# Patient Record
Sex: Female | Born: 1950 | Race: White | Hispanic: No | Marital: Married | Smoking: Never smoker
Health system: Southern US, Community
[De-identification: ages and names within clinical notes are randomized; demographics above are authoritative.]

## PROBLEM LIST (undated history)

## (undated) DIAGNOSIS — R319 Hematuria, unspecified: Secondary | ICD-10-CM

## (undated) DIAGNOSIS — N329 Bladder disorder, unspecified: Secondary | ICD-10-CM

## (undated) DIAGNOSIS — F411 Generalized anxiety disorder: Secondary | ICD-10-CM

## (undated) DIAGNOSIS — N289 Disorder of kidney and ureter, unspecified: Secondary | ICD-10-CM

## (undated) DIAGNOSIS — R011 Cardiac murmur, unspecified: Secondary | ICD-10-CM

## (undated) DIAGNOSIS — K219 Gastro-esophageal reflux disease without esophagitis: Secondary | ICD-10-CM

## (undated) HISTORY — DX: Generalized anxiety disorder: F41.1

## (undated) HISTORY — PX: TUBAL LIGATION: SHX77

## (undated) HISTORY — PX: CHOLECYSTECTOMY: SHX55

---

## 1983-01-14 HISTORY — PX: OTHER SURGICAL HISTORY: SHX169

## 1988-01-14 HISTORY — PX: CHOLECYSTECTOMY: SHX55

## 1999-02-21 ENCOUNTER — Other Ambulatory Visit: Admission: RE | Admit: 1999-02-21 | Discharge: 1999-02-21 | Payer: Self-pay | Admitting: Family Medicine

## 1999-08-05 ENCOUNTER — Encounter: Admission: RE | Admit: 1999-08-05 | Discharge: 1999-08-21 | Payer: Self-pay | Admitting: Family Medicine

## 2000-05-28 ENCOUNTER — Other Ambulatory Visit: Admission: RE | Admit: 2000-05-28 | Discharge: 2000-05-28 | Payer: Self-pay | Admitting: Family Medicine

## 2004-09-02 ENCOUNTER — Ambulatory Visit (HOSPITAL_COMMUNITY): Admission: RE | Admit: 2004-09-02 | Discharge: 2004-09-02 | Payer: Self-pay | Admitting: Orthopedic Surgery

## 2005-02-03 ENCOUNTER — Encounter: Admission: RE | Admit: 2005-02-03 | Discharge: 2005-02-28 | Payer: Self-pay | Admitting: Orthopedic Surgery

## 2008-03-09 ENCOUNTER — Encounter (INDEPENDENT_AMBULATORY_CARE_PROVIDER_SITE_OTHER): Payer: Self-pay | Admitting: General Surgery

## 2008-03-09 ENCOUNTER — Observation Stay (HOSPITAL_COMMUNITY): Admission: EM | Admit: 2008-03-09 | Discharge: 2008-03-10 | Payer: Self-pay | Admitting: Emergency Medicine

## 2008-03-09 HISTORY — PX: APPENDECTOMY: SHX54

## 2009-01-13 HISTORY — PX: LAPAROSCOPIC APPENDECTOMY: SUR753

## 2010-04-30 LAB — URINALYSIS, ROUTINE W REFLEX MICROSCOPIC
Glucose, UA: NEGATIVE mg/dL
Ketones, ur: NEGATIVE mg/dL
Protein, ur: NEGATIVE mg/dL
Specific Gravity, Urine: >1.046 — ABNORMAL HIGH (ref 1.005–1.030)
Urobilinogen, UA: 0.2 mg/dL (ref 0.0–1.0)
pH: 6 (ref 5.0–8.0)

## 2010-04-30 LAB — CBC
Hemoglobin: 13.6 g/dL (ref 12.0–15.0)
MCHC: 35.3 g/dL (ref 30.0–36.0)
MCV: 93.3 fL (ref 78.0–100.0)
Platelets: 219 10*3/uL (ref 150–400)
RDW: 12 % (ref 11.5–15.5)

## 2010-04-30 LAB — DIFFERENTIAL
Basophils Relative: 1 % (ref 0–1)
Lymphs Abs: 0.8 10*3/uL (ref 0.7–4.0)
Neutro Abs: 15.2 10*3/uL — ABNORMAL HIGH (ref 1.7–7.7)
Neutrophils Relative %: 86 % — ABNORMAL HIGH (ref 43–77)

## 2010-04-30 LAB — COMPREHENSIVE METABOLIC PANEL
ALT: 16 U/L (ref 0–35)
AST: 23 U/L (ref 0–37)
Albumin: 4.2 g/dL (ref 3.5–5.2)
BUN: 11 mg/dL (ref 6–23)
Creatinine, Ser: 0.8 mg/dL (ref 0.4–1.2)
GFR calc non Af Amer: >60 mL/min (ref >60)
Glucose, Bld: 126 mg/dL — ABNORMAL HIGH (ref 70–99)
Total Bilirubin: 1.5 mg/dL — ABNORMAL HIGH (ref 0.3–1.2)

## 2010-04-30 LAB — URINE MICROSCOPIC-ADD ON

## 2010-05-28 NOTE — Op Note (Signed)
[name], [name]                 ACCOUNT NO.:  [id number]   MEDICAL RECORD NO.:  [id number]          PATIENT TYPE:  OBV   LOCATION:  5128                         FACILITY:  [facility]   PHYSICIAN:  Cherylynn Ridges, M.D.    DATE OF BIRTH:  1950/06/02   DATE OF PROCEDURE:  03/09/2008  DATE OF DISCHARGE:  03/09/2008                               OPERATIVE REPORT   PREOPERATIVE DIAGNOSIS:  Acute appendicitis.   PREOPERATIVE DIAGNOSIS:  Acute suppurative appendicitis.   PROCEDURE:  Laparoscopic appendectomy.   SURGEON:  Cherylynn Ridges, MD   ANESTHESIA:  General endotracheal.   ESTIMATED BLOOD LOSS:  Less than 20 mL.   COMPLICATIONS:  None.   CONDITION:  Stable.   FINDINGS:  Acute supparative appendicitis in the right paracecal area.   INDICATION FOR OPERATION:  The patient is a 60 year old woman with  abdominal pain starting about 24 hours ago who comes in with a CT scan  demonstrating acute appendicitis.   OPERATION:  The patient was taken to the operating room, and placed on  the table in the supine position.  After an adequate endotracheal  anesthetic was administered, she was prepped and draped in the usual  sterile manner exposing the midline and the entire abdomen.   An infraumbilical midline incision was made using a #15 blade and taken  down to the midline fascia.  We incised the fascia with a 15 blade and  grabbed the edges with the Kocher clamp and bluntly dissected down into  the peritoneal cavity with minimal difficulty.  A pursestring suture of  0 Vicryl was passed around the fascial opening, and then a Hasson  cannula passed into the peritoneal cavity and secured in place with a  pursestring suture.  We subsequently placed a left lower quadrant 11-mm  cannula and a right upper quadrant 5-mm cannula under direct vision.   Within the patient in Trendelenburg and the left side tilted down, we  were able to identify the appendix in the right lower quadrant.  It was  in  the lateral paracecal area.  We mobilized it cauterizing the  mesoappendix sufficient in order to control bleeding, and using an Endo-  GIA 3.5 mm closure with blue stapler across the base of the appendix.  This allowed [place] to then retrieve the appendix through the left lower  quadrant cannula using the EndoCatch bag.  I removed it from the  abdominal cavity.   We then irrigated with about a litre and a half to 2 L of saline  solution in the right lower quadrant.  Once this was done, we removed  the infraumbilical cannula, closing off the fascia using a pursestring  suture which was in place.  We aspirated all fluid and gas from above  the liver as we removed all cannulas.   We injected all incision sites with 0.25% Marcaine with epinephrine,  then we closed the infraumbilical and the left lower quadrant skin sites  using a running subcuticular stitch of 4-0 Monocryl.  Dermabond, Steri-  Strips, and Tegaderm were used on all incisions.  All  counts were  correct.      Cherylynn Ridges, M.D.  Electronically Signed     JOW/MEDQ  D:  03/09/2008  T:  03/10/2008  Job:  [id number]

## 2010-05-28 NOTE — H&P (Signed)
[name], [name]                 ACCOUNT NO.:  [id number]   MEDICAL RECORD NO.:  [id number]          PATIENT TYPE:  EMS   LOCATION:  MAJO                         FACILITY:  [facility]   PHYSICIAN:  Cherylynn Ridges, M.D.    DATE OF BIRTH:  11-01-1950   DATE OF ADMISSION:  03/09/2008  DATE OF DISCHARGE:                              HISTORY & PHYSICAL   PRIMARY CARE PHYSICIAN:  Dr. Lindaann Pascal in [place].   CHIEF COMPLAINT:  The patient is a 60 year old female with abdominal  pain localized to the right lower quadrant clinically and radiologically  convincing for acute appendicitis.   HISTORY OF PRESENT ILLNESS:  The patient started getting sick about 8  o'clock yesterday evening.  At that time, she had some diffuse upper  abdominal discomfort and pain, which she thought was gas.  She self-  treated herself with Tums and Pepto-Bismol without relief.  She was not  able to sleep most of the night.  The pain worsened today where she went  to see a primary care physician at [facility].  At that time, they sent her  to [facility] where a CT scan was done,  which confirmed the presence of acute appendicitis.  She was sent to the  emergency room for evaluation and care.  No call was made to the surgeon  on-call.   PAST MEDICAL HISTORY:  Unremarkable.  She has no history of cardiac,  renal, pulmonary, or liver disease.   PAST SURGICAL HISTORY:  She has had open cholecystectomy performed over  20 years ago and open hiatal hernia repair over 25 years ago.   ALLERGIES:  She has no known drug allergies.   MEDICATIONS:  She takes no medications on a regular basis.   FAMILY HISTORY:  Noncontributory.   REVIEW OF SYSTEMS:  Her last bowel movement was yesterday morning.  She  has been pretty much n.p.o. all day today.  She has had her bowel  movement yesterday morning was normal.  She has had fevers and chills,  nausea but no vomiting.   PHYSICAL EXAMINATION:  VITAL  SIGNS:  Today, she has temperature of  100.5, pulse of 109, blood pressure is 130/75.  HEENT:  She is normocephalic and atraumatic and anicteric.  NECK:  Supple.  No palpable masses.  She has no carotid bruits.  LUNGS:  Clear to auscultation.  CARDIAC:  Regular rhythm.  She is tachycardiac at about 109.  She has a  grade 1/6 systolic ejection murmur at the left lower sternal border  without radiation.  ABDOMEN:  Soft.  Bowel nondistended.  She has an upper midline scar from  both her cholecystectomy and her open hiatal hernia repair.  No other  surgical scars.  She has a few bowel sounds which are present.  She is  tender in her right lower quadrant mildly so on the right upper  quadrant.  No Rovsing's sign.  She has guarding and rebound.  RECTAL:  Not performed.  PELVIC:  Not performed.   LABORATORY STUDIES:  She has a white count of  17,000 with a left shift.  Her hemoglobin is 13.6, hematocrit is normal.  Electrolytes are within  normal limits.  A 12-lead EKG and a portable upright chest x-ray are  pending as preoperative testing.   IMPRESSION:  I have not seen this CAT scan yet, but apparently it does  confirm the presence of acute appendicitis, which I would conclude  clinically based on the patient's history of localized tenderness and  examination.  The patient was taken to the operating room for possible  laparoscopic appendectomy, although she has had upper midline incisions  before, it does not appear as though this will prohibit I being able to  do this laparoscopically.  If not, then we will make right lower  quadrant incision perform an open appendectomy through Rocky-Davis  incision or modified McBurney's.  They explained this to the patient,  and they understand and wish to proceed as soon as possible.  Consent  will be signed and placed on the chart.      Cherylynn Ridges, M.D.  Electronically Signed     JOW/MEDQ  D:  03/09/2008  T:  03/10/2008  Job:  [id number]    cc:   Dr. Lindaann Pascal, [place]

## 2012-11-30 ENCOUNTER — Ambulatory Visit (INDEPENDENT_AMBULATORY_CARE_PROVIDER_SITE_OTHER): Payer: BC Managed Care – PPO | Admitting: Nurse Practitioner

## 2012-11-30 ENCOUNTER — Encounter: Payer: Self-pay | Admitting: Nurse Practitioner

## 2012-11-30 VITALS — BP 115/66 | HR 63 | Temp 97.0°F | Ht 65.0 in | Wt 127.0 lb

## 2012-11-30 DIAGNOSIS — Z0000 Encounter for general adult medical examination without abnormal findings: Secondary | ICD-10-CM

## 2012-11-30 DIAGNOSIS — Z23 Encounter for immunization: Secondary | ICD-10-CM

## 2012-11-30 DIAGNOSIS — Z01419 Encounter for gynecological examination (general) (routine) without abnormal findings: Secondary | ICD-10-CM

## 2012-11-30 LAB — POCT UA - MICROSCOPIC ONLY
Bacteria, U Microscopic: NEGATIVE
Casts, Ur, LPF, POC: NEGATIVE

## 2012-11-30 LAB — POCT CBC
HCT, POC: 39.6 % (ref 37.7–47.9)
Lymph, poc: 1.7 (ref 0.6–3.4)
POC LYMPH PERCENT: 30.7 %L (ref 10–50)
Platelet Count, POC: 215 10*3/uL (ref 142–424)

## 2012-11-30 LAB — POCT URINALYSIS DIPSTICK
Glucose, UA: NEGATIVE
Ketones, UA: NEGATIVE
Protein, UA: NEGATIVE
Urobilinogen, UA: NEGATIVE
pH, UA: 7.5

## 2012-11-30 NOTE — Patient Instructions (Signed)

## 2012-11-30 NOTE — Progress Notes (Signed)
  Subjective:    Patient ID: [name], female    DOB: [birth year], 62 y.o.   MRN: [id number]  HPI Patient here today for CPE and PAP- She has no complaints today- no medical problems and on no medications.    Review of Systems  Constitutional: Negative.   HENT: Negative.   Eyes: Negative.   Respiratory: Negative.   Cardiovascular: Negative.   Gastrointestinal: Negative.   Endocrine: Negative.   Genitourinary: Negative.   Neurological: Negative.   Psychiatric/Behavioral: Negative.        Objective:   Physical Exam  Constitutional: She is oriented to person, place, and time. She appears well-developed and well-nourished.  HENT:  Head: Normocephalic.  Right Ear: Hearing, tympanic membrane, external ear and ear canal normal.  Left Ear: Hearing, tympanic membrane, external ear and ear canal normal.  Nose: Nose normal.  Mouth/Throat: Uvula is midline and oropharynx is clear and moist.  Eyes: Conjunctivae and EOM are normal. Pupils are equal, round, and reactive to light.  Neck: Normal range of motion and full passive range of motion without pain. Neck supple. No JVD present. Carotid bruit is not present. No mass and no thyromegaly present.  Cardiovascular: Normal rate, normal heart sounds and intact distal pulses.   No murmur heard. Pulmonary/Chest: Effort normal and breath sounds normal. Right breast exhibits no inverted nipple, no mass, no nipple discharge, no skin change and no tenderness. Left breast exhibits no inverted nipple, no mass, no nipple discharge, no skin change and no tenderness.  Abdominal: Soft. Bowel sounds are normal. She exhibits no mass. There is no tenderness.  Genitourinary: Vagina normal and uterus normal. No breast swelling, tenderness, discharge or bleeding.  bimanual exam-No adnexal masses or tenderness. Cervix parous and pink- no discharge  Musculoskeletal: Normal range of motion.  Lymphadenopathy:    She has no cervical adenopathy.  Neurological:  She is alert and oriented to person, place, and time.  Skin: Skin is warm and dry.  Psychiatric: She has a normal mood and affect. Her behavior is normal. Judgment and thought content normal.     BP 115/66  Pulse 63  Temp(Src) 97 F (36.1 C) (Oral)  Ht 5\' 5"  (1.651 m)  Wt 127 lb (57.607 kg)  BMI 21.13 kg/m2      Assessment & Plan:   1. Annual physical exam   2. Need for prophylactic vaccination and inoculation against influenza   3. Encounter for routine gynecological examination    Orders Placed This Encounter  Procedures  . CMP14+EGFR  . NMR, lipoprofile  . Thyroid Panel With TSH  . POCT urinalysis dipstick  . POCT UA - Microscopic Only  . POCT CBC     Continue all meds Labs pending Diet and exercise encouraged Health maintenance reviewed Follow up in 1year and prn  Mary-Margaret Daphine Deutscher, FNP

## 2012-12-01 ENCOUNTER — Telehealth: Payer: Self-pay | Admitting: Family Medicine

## 2012-12-01 NOTE — Telephone Encounter (Signed)
In [place] office at [facility] they want you to call in something for a uti because she is burning you saw her yesterday

## 2012-12-01 NOTE — Telephone Encounter (Signed)
Patient urine was clear yesterday needs to bring in urine to recheck

## 2012-12-02 LAB — CMP14+EGFR
ALT: 13 IU/L (ref 0–32)
AST: 14 IU/L (ref 0–40)
Albumin: 4.5 g/dL (ref 3.6–4.8)
Alkaline Phosphatase: 61 IU/L (ref 39–117)
BUN: 17 mg/dL (ref 8–27)
CO2: 26 mmol/L (ref 18–29)
Chloride: 104 mmol/L (ref 97–108)
GFR calc Af Amer: 87 mL/min/{1.73_m2} (ref >59)
Glucose: 101 mg/dL — ABNORMAL HIGH (ref 65–99)
Total Bilirubin: 0.8 mg/dL (ref 0.0–1.2)

## 2012-12-02 LAB — THYROID PANEL WITH TSH
T3 Uptake Ratio: 31 % (ref 24–39)
T4, Total: 8.8 ug/dL (ref 4.5–12.0)
TSH: 1.14 u[IU]/mL (ref 0.450–4.500)

## 2012-12-02 LAB — NMR, LIPOPROFILE
Cholesterol: 182 mg/dL (ref <200)
LDL Particle Number: 1085 nmol/L — ABNORMAL HIGH (ref <1000)
LDL Size: 21.3 nm (ref >20.5)
LDLC SERPL CALC-MCNC: 91 mg/dL (ref <100)
LP-IR Score: <25 (ref <=45)
Small LDL Particle Number: 109 nmol/L (ref <=527)
Triglycerides by NMR: 69 mg/dL (ref <150)

## 2012-12-02 NOTE — Telephone Encounter (Signed)
Talked to health care nurse at [facility] went over her last 3 urines and they all had blood she is going to send urine for culture

## 2012-12-04 LAB — PAP IG W/ RFLX HPV ASCU: PAP Smear Comment: 0

## 2012-12-08 ENCOUNTER — Encounter: Payer: Self-pay | Admitting: *Deleted

## 2012-12-08 NOTE — Progress Notes (Signed)
Quick Note:  Copy of labs sent to patient ______ 

## 2013-01-20 ENCOUNTER — Other Ambulatory Visit: Payer: Self-pay | Admitting: Urology

## 2013-01-21 ENCOUNTER — Encounter (HOSPITAL_BASED_OUTPATIENT_CLINIC_OR_DEPARTMENT_OTHER): Payer: Self-pay | Admitting: *Deleted

## 2013-01-21 NOTE — Progress Notes (Signed)
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 SHE STATED TO JUST CALL AND LEAVE MESSAGE FOR HER WITH INSTRUCTION. WHICH IS DID, NPO AFTER MN. ARRIVE AT 1100. NEEDS HG AND VERIFY HX.

## 2013-01-24 ENCOUNTER — Encounter (HOSPITAL_BASED_OUTPATIENT_CLINIC_OR_DEPARTMENT_OTHER): Payer: Self-pay

## 2013-01-24 ENCOUNTER — Ambulatory Visit (HOSPITAL_BASED_OUTPATIENT_CLINIC_OR_DEPARTMENT_OTHER): Payer: BC Managed Care – PPO | Admitting: Anesthesiology

## 2013-01-24 ENCOUNTER — Encounter (HOSPITAL_BASED_OUTPATIENT_CLINIC_OR_DEPARTMENT_OTHER): Payer: BC Managed Care – PPO | Admitting: Anesthesiology

## 2013-01-24 ENCOUNTER — Ambulatory Visit (HOSPITAL_BASED_OUTPATIENT_CLINIC_OR_DEPARTMENT_OTHER)
Admission: RE | Admit: 2013-01-24 | Discharge: 2013-01-24 | Disposition: A | Discharge status: Home / Self Care | Payer: BC Managed Care – PPO | Source: Ambulatory Visit | Attending: Urology | Admitting: Urology

## 2013-01-24 ENCOUNTER — Encounter (HOSPITAL_BASED_OUTPATIENT_CLINIC_OR_DEPARTMENT_OTHER)
Admission: RE | Disposition: A | Discharge status: Home / Self Care | Payer: Self-pay | Source: Ambulatory Visit | Attending: Urology

## 2013-01-24 DIAGNOSIS — L539 Erythematous condition, unspecified: Secondary | ICD-10-CM | POA: Insufficient documentation

## 2013-01-24 DIAGNOSIS — R3129 Other microscopic hematuria: Secondary | ICD-10-CM | POA: Insufficient documentation

## 2013-01-24 DIAGNOSIS — N329 Bladder disorder, unspecified: Secondary | ICD-10-CM

## 2013-01-24 HISTORY — DX: Hematuria, unspecified: R31.9

## 2013-01-24 HISTORY — PX: CYSTOSCOPY WITH BIOPSY: SHX5122

## 2013-01-24 HISTORY — DX: Bladder disorder, unspecified: N32.9

## 2013-01-24 HISTORY — DX: Cardiac murmur, unspecified: R01.1

## 2013-01-24 HISTORY — PX: CYSTOSCOPY W/ RETROGRADES: SHX1426

## 2013-01-24 LAB — POCT HEMOGLOBIN-HEMACUE: HEMOGLOBIN: 12.8 g/dL (ref 12.0–15.0)

## 2013-01-24 SURGERY — CYSTOSCOPY, WITH BIOPSY
Anesthesia: General | Site: Ureter

## 2013-01-24 MED ORDER — DEXAMETHASONE SODIUM PHOSPHATE 4 MG/ML IJ SOLN
INTRAMUSCULAR | Status: DC | PRN
  Administered 2013-01-24: 10 mg via INTRAVENOUS

## 2013-01-24 MED ORDER — ONDANSETRON HCL 4 MG/2ML IJ SOLN
INTRAMUSCULAR | Status: DC | PRN
  Administered 2013-01-24: 4 mg via INTRAVENOUS

## 2013-01-24 MED ORDER — HYOSCYAMINE SULFATE 0.125 MG PO TABS: 0.1250 mg | ORAL_TABLET | ORAL | Status: DC | PRN

## 2013-01-24 MED ORDER — CEPHALEXIN 500 MG PO CAPS: 500.0000 mg | ORAL_CAPSULE | Freq: Three times a day (TID) | ORAL | Status: DC

## 2013-01-24 MED ORDER — STERILE WATER FOR IRRIGATION IR SOLN
Status: DC | PRN
  Administered 2013-01-24: 3000 mL

## 2013-01-24 MED ORDER — BELLADONNA ALKALOIDS-OPIUM 16.2-60 MG RE SUPP
RECTAL | Status: AC
  Filled 2013-01-24: qty 1

## 2013-01-24 MED ORDER — PHENAZOPYRIDINE HCL 200 MG PO TABS: 200.0000 mg | ORAL_TABLET | Freq: Three times a day (TID) | ORAL | Status: DC | PRN

## 2013-01-24 MED ORDER — PROPOFOL 10 MG/ML IV BOLUS
INTRAVENOUS | Status: DC | PRN
  Administered 2013-01-24: 120 mg via INTRAVENOUS

## 2013-01-24 MED ORDER — MIDAZOLAM HCL 2 MG/2ML IJ SOLN
INTRAMUSCULAR | Status: AC
  Filled 2013-01-24: qty 2

## 2013-01-24 MED ORDER — FENTANYL CITRATE 0.05 MG/ML IJ SOLN
25.0000 ug | INTRAMUSCULAR | Status: DC | PRN
  Filled 2013-01-24: qty 1

## 2013-01-24 MED ORDER — LIDOCAINE HCL (CARDIAC) 20 MG/ML IV SOLN
INTRAVENOUS | Status: DC | PRN
  Administered 2013-01-24: 40 mg via INTRAVENOUS

## 2013-01-24 MED ORDER — FENTANYL CITRATE 0.05 MG/ML IJ SOLN
INTRAMUSCULAR | Status: AC
  Filled 2013-01-24: qty 4

## 2013-01-24 MED ORDER — PROMETHAZINE HCL 25 MG/ML IJ SOLN
6.2500 mg | INTRAMUSCULAR | Status: DC | PRN
  Filled 2013-01-24: qty 1

## 2013-01-24 MED ORDER — LACTATED RINGERS IV SOLN
INTRAVENOUS | Status: DC
  Administered 2013-01-24: 50 mL/h via INTRAVENOUS
  Administered 2013-01-24: 11:00:00 via INTRAVENOUS
  Filled 2013-01-24: qty 1000

## 2013-01-24 MED ORDER — FENTANYL CITRATE 0.05 MG/ML IJ SOLN
INTRAMUSCULAR | Status: DC | PRN
  Administered 2013-01-24 (×2): 25 ug via INTRAVENOUS
  Administered 2013-01-24: 50 ug via INTRAVENOUS

## 2013-01-24 MED ORDER — SENNOSIDES-DOCUSATE SODIUM 8.6-50 MG PO TABS: 1.0000 | ORAL_TABLET | Freq: Two times a day (BID) | ORAL | Status: DC

## 2013-01-24 MED ORDER — BELLADONNA ALKALOIDS-OPIUM 16.2-60 MG RE SUPP
RECTAL | Status: DC | PRN
  Administered 2013-01-24: 1 via RECTAL

## 2013-01-24 MED ORDER — MIDAZOLAM HCL 5 MG/5ML IJ SOLN
INTRAMUSCULAR | Status: DC | PRN
  Administered 2013-01-24: 1 mg via INTRAVENOUS

## 2013-01-24 MED ORDER — IOHEXOL 350 MG/ML SOLN
INTRAVENOUS | Status: DC | PRN
  Administered 2013-01-24: 9 mL

## 2013-01-24 MED ORDER — HYDROCODONE-ACETAMINOPHEN 5-325 MG PO TABS: 1.0000 | ORAL_TABLET | ORAL | Status: DC | PRN

## 2013-01-24 MED ORDER — PHENAZOPYRIDINE HCL 200 MG PO TABS
200.0000 mg | ORAL_TABLET | Freq: Three times a day (TID) | ORAL | Status: DC
  Administered 2013-01-24: 200 mg via ORAL
  Filled 2013-01-24: qty 1
  Filled 2013-01-24: qty 2

## 2013-01-24 MED ORDER — CEFAZOLIN SODIUM-DEXTROSE 2-3 GM-% IV SOLR
2.0000 g | INTRAVENOUS | Status: AC
  Administered 2013-01-24: 2 g via INTRAVENOUS
  Filled 2013-01-24: qty 50

## 2013-01-24 MED ORDER — LACTATED RINGERS IV SOLN
INTRAVENOUS | Status: DC
  Filled 2013-01-24: qty 1000

## 2013-01-24 MED ORDER — LIDOCAINE HCL 2 % EX GEL
CUTANEOUS | Status: DC | PRN
  Administered 2013-01-24: 1 via URETHRAL

## 2013-01-24 SURGICAL SUPPLY — 17 items
BAG DRAIN URO-CYSTO SKYTR STRL (DRAIN) ×3 IMPLANT
BAG DRN UROCATH (DRAIN) ×2
CANISTER SUCT LVC 12 LTR MEDI- (MISCELLANEOUS) ×3 IMPLANT
CATH INTERMIT  6FR 70CM (CATHETERS) ×1 IMPLANT
CLOTH BEACON ORANGE TIMEOUT ST (SAFETY) ×3 IMPLANT
DRAPE CAMERA CLOSED 9X96 (DRAPES) ×3 IMPLANT
ELECT REM PT RETURN 9FT ADLT (ELECTROSURGICAL) ×3
ELECTRODE REM PT RTRN 9FT ADLT (ELECTROSURGICAL) ×2 IMPLANT
GLOVE BIO SURGEON STRL SZ7 (GLOVE) ×3 IMPLANT
GLOVE INDICATOR 7.5 STRL GRN (GLOVE) IMPLANT
GLOVE SURG SS PI 7.5 STRL IVOR (GLOVE) ×2 IMPLANT
GOWN STRL REIN XL XLG (GOWN DISPOSABLE) ×2 IMPLANT
GOWN STRL REUS W/TWL XL LVL3 (GOWN DISPOSABLE) ×2 IMPLANT
NEEDLE HYPO 22GX1.5 SAFETY (NEEDLE) IMPLANT
NS IRRIG 500ML POUR BTL (IV SOLUTION) ×1 IMPLANT
PACK CYSTOSCOPY (CUSTOM PROCEDURE TRAY) ×3 IMPLANT
WATER STERILE IRR 3000ML UROMA (IV SOLUTION) ×3 IMPLANT

## 2013-01-24 NOTE — Anesthesia Procedure Notes (Signed)
Procedure Name: LMA Insertion Date/Time: 01/24/2013 1:00 PM Performed by: Mechele Claude Pre-anesthesia Checklist: Patient identified, Emergency Drugs available, Suction available and Patient being monitored Patient Re-evaluated:Patient Re-evaluated prior to inductionOxygen Delivery Method: Circle System Utilized Preoxygenation: Pre-oxygenation with 100% oxygen Intubation Type: IV induction Ventilation: Mask ventilation without difficulty LMA: LMA inserted LMA Size: 4.0 Number of attempts: 1 Airway Equipment and Method: bite block Placement Confirmation: positive ETCO2 Tube secured with: Tape Dental Injury: Teeth and Oropharynx as per pre-operative assessment

## 2013-01-24 NOTE — Anesthesia Preprocedure Evaluation (Addendum)
Anesthesia Evaluation  Patient identified by MRN, date of birth, ID band Patient awake    Reviewed: Allergy & Precautions, H&P , NPO status , Patient's Chart, lab work & pertinent test results  Airway Mallampati: III TM Distance: >3 FB Neck ROM: Full    Dental  (+) Teeth Intact and Dental Advisory Given   Pulmonary neg pulmonary ROS,  breath sounds clear to auscultation  Pulmonary exam normal       Cardiovascular negative cardio ROS  Rhythm:Regular Rate:Normal     Neuro/Psych negative neurological ROS  negative psych ROS   GI/Hepatic negative GI ROS, Neg liver ROS,   Endo/Other  negative endocrine ROS  Renal/GU negative Renal ROS  negative genitourinary   Musculoskeletal negative musculoskeletal ROS (+)   Abdominal   Peds  Hematology negative hematology ROS (+)   Anesthesia Other Findings   Reproductive/Obstetrics                          Anesthesia Physical Anesthesia Plan  ASA: I  Anesthesia Plan: General   Post-op Pain Management:    Induction:   Airway Management Planned: LMA  Additional Equipment:   Intra-op Plan:   Post-operative Plan: Extubation in OR  Informed Consent: I have reviewed the patients History and Physical, chart, labs and discussed the procedure including the risks, benefits and alternatives for the proposed anesthesia with the patient or authorized representative who has indicated his/her understanding and acceptance.   Dental advisory given  Plan Discussed with: CRNA  Anesthesia Plan Comments:        Anesthesia Quick Evaluation

## 2013-01-24 NOTE — H&P (Signed)
Urology History and Physical Exam  CC: Microscopic Hematuria. Bladder lesion  HPI:  63 year old female presents today for hematuria and bladder lesion.  Her hematuria microscopic in nature.  She underwent a workup with CT hematuria protocol performed 12/14/12.  This was negative for stones, hydronephrosis, filling defects, or enhancing renal masses.  She also had a bladder lesion.  This was discovered during workup with office cystoscopy.  This was flat and erythematous in nature.  It was approximately 1 cm in size.  It was on the right lateral anterior portion of the bladder wall anterior to the right ureteral orifice.  This was concerning for CIS.  This is a new problem.  Nothing makes it better or worse.  She presents today for cystoscopy, bladder biopsy, and bilateral retrograde pyelograms.  We have discussed the risks, benefits, alternatives, and likelihood of achieving goals.  UA 01/15/12 was negative for signs of infection. Negative gross hematuria.  PMH: Past Medical History  Diagnosis Date  . Lesion of bladder   . Hematuria     PSH: Past Surgical History  Procedure Laterality Date  . Tubal ligation    . Appendectomy  03-09-2008  . Cholecystectomy  1990  . Open hiatal hernia repair  1985    Allergies: Allergies  Allergen Reactions  . Asa [Aspirin]   . Ciprofloxacin Hcl Other (See Comments)    Burning  both legs below knees as if on fire.  . Tagamet [Cimetidine]     Medications: No prescriptions prior to admission     Social History: History   Social History  . Marital Status: Married    Spouse Name: N/A    Number of Children: N/A  . Years of Education: N/A   Occupational History  . Not on file.   Social History Main Topics  . Smoking status: Never Smoker   . Smokeless tobacco: Not on file  . Alcohol Use: No  . Drug Use: No  . Sexual Activity: Not on file   Other Topics Concern  . Not on file   Social History Narrative  . No narrative on file     Family History: Family History  Problem Relation Age of Onset  . COPD Mother   . Cancer Father     Review of Systems: Positive: None. Negative: Fever, SOB, or chest pain.  A further 10 point review of systems was negative except what is listed in the HPI.  Physical Exam: Filed Vitals:   01/24/13 1049  BP: 115/64  Pulse: 59  Temp: 97.3 F (36.3 C)  Resp: 18    General: No acute distress.  Awake. Head:  Normocephalic.  Atraumatic. ENT:  EOMI.  Mucous membranes moist Neck:  Supple.  No lymphadenopathy. CV:  S1 present. S2 present. Regular rate. Pulmonary: Equal effort bilaterally.  Clear to auscultation bilaterally. Abdomen: Soft.  Non- tender to palpation. Skin:  Normal turgor.  No visible rash. Extremity: No gross deformity of bilateral upper extremities.  No gross deformity of    bilateral lower extremities. Neurologic: Alert. Appropriate mood.    Studies:  Recent Labs     01/24/13  1108  HGB  12.8    No results found for this basename: NA, K, CL, CO2, BUN, CREATININE, CALCIUM, MAGNESIUM, GFRNONAA, GFRAA,  in the last 72 hours   No results found for this basename: PT, INR, APTT,  in the last 72 hours   No components found with this basename: ABG,     Assessment:  Microscopic Hematuria.  Bladder lesion  Plan: To OR for cystoscopy, bladder biopsy, and bilateral retrograde pyelograms.

## 2013-01-24 NOTE — Discharge Instructions (Signed)
DISCHARGE INSTRUCTIONS FOR TRANSURETHRAL SURGERY OF BLADDER ° °MEDICATIONS:  ° °1. Resume all your other meds from home. ° ° ° °ACTIVITY °1. No heavy lifting >10 pounds for 2 weeks °2. No sexual activity for 2 weeks °3. No strenuous activity for 2 weeks °4. No driving while on narcotic pain medications °5. Drink plenty of water °6. Continue to walk at home - you can still get blood clots when you are at  °home, so keep active, but don't over do it. °7. Your urine may have some blood in it - make sure you drink plenty of water,  °call or come to the ER immediately if your catheter stops draining ° °FOLEY CATHETER  (If you go home with a catheter in place.) °1. Make sure your catheter is attached to your leg at all times - do not let  °anything pull on it °2. If the urine is your tube starts looking dark red or if it stops draining,  °call us immediately or come to the ER °3. Drink plenty of water, if you do notice your urine looking darking, sit down,  °relax and drink lots of water °4. You will be given a leg bag as well as an overnight bag for your catheter -  °MAKE SURE ATTACHED TO YOUR LEG AT ALL TIMES WITH TAPE OR LEG STRAP ° °BATHING °1. You can shower.  You may take a bath unless you have a Foley catheter in place. ° °SIGNS/SYMPTOMS TO CALL: °1. Please call us if you have a fever greater than 101.5, uncontrolled  °nausea/vomiting, uncontrolled pain, dizziness, unable to urinate, chest pain, shortness of breath, leg swelling, leg pain, or any other concerns  °or questions. ° °You can reach us at [phone]. ° ° °Post Anesthesia Home Care Instructions ° °Activity: °Get plenty of rest for the remainder of the day. A responsible adult should stay with you for 24 hours following the procedure.  °For the next 24 hours, DO NOT: °-Drive a car °-Operate machinery °-Drink alcoholic beverages °-Take any medication unless instructed by your physician °-Make any legal decisions or sign important papers. ° °Meals: °Start  with liquid foods such as gelatin or soup. Progress to regular foods as tolerated. Avoid greasy, spicy, heavy foods. If nausea and/or vomiting occur, drink only clear liquids until the nausea and/or vomiting subsides. Call your physician if vomiting continues. ° °Special Instructions/Symptoms: °Your throat may feel dry or sore from the anesthesia or the breathing tube placed in your throat during surgery. If this causes discomfort, gargle with warm salt water. The discomfort should disappear within 24 hours. ° °

## 2013-01-24 NOTE — Op Note (Signed)
Urology Operative Report  Date of Procedure: 01/24/13  Surgeon: Rolan Bucco, MD Assistant:  None  Preoperative Diagnosis: Microscopic hematuria. Bladder lesion. Postoperative Diagnosis:  Same  Procedure(s): Cystoscopy with bladder biopsy. Bilateral retrograde pyelograms with interpretation.  Estimated blood loss: Minimal  Specimen: Bladder biopsy.  Drains: None  Complications: None  Findings: Negative filling defects or hydronephrosis of the bilateral ureters or renal pelvis on retrograde pyelogram. Areas of erythema involving the bladder trigone, the right lateral bladder wall, in the posterior bladder wall. No papillary bladder tumors.  History of present illness: 63-year-old female presents today for microscopic hematuria and bladder lesion. She was found to have erythema in her bladder which was concerning for possible CIS on office cystoscopy workup for microscopic hematuria.   Procedure in detail: After informed consent was obtained, the patient was taken to the operating room. They were placed in the supine position. SCDs were turned on and in place. IV antibiotics were infused, and general anesthesia was induced. A timeout was performed in which the correct patient, surgical site, and procedure were identified and agreed upon by the team.  The patient was placed in a dorsolithotomy position, making sure to pad all pertinent neurovascular pressure points. A belladonna and opium suppository was placed into the rectum. The genitals were prepped and draped in the usual sterile fashion.  A rigid cystoscope was passed through the urethra into the bladder. The bladder was drained and then systematically evaluated to visualize the entire surface the bladder. I was able to evaluate the bladder with a 12 and 70 lens. As noted to be erythema in the trigone, right lateral bladder wall, and the posterior bladder wall.  I then used a cold cup biopsy forceps to biopsy the posterior  bladder wall, bladder trigone, in the right lateral bladder wall. Each of these were sent separately for permanent pathology. I then fulgurated the areas of biopsy in the area around the biopsy with a Bugbee electrode.  I then obtained bilateral retrograde pyelograms I cannulating the right and left ureter orifice with a 5 French ureter catheter. I injected 5 cc of Omnipaque contrast into each side. Both of these were negative for filling defects or hydronephrosis. Each side emptied out well in a prompt manner.  Her bladder was drained, I then placed 10 cc of lidocaine jelly into the urethra. She's placed back in a supine position, anesthesia was reversed, and she was [facility].  All counts were correct at the end of the case.

## 2013-01-24 NOTE — Transfer of Care (Signed)
Immediate Anesthesia Transfer of Care Note  Patient: [name]  Procedure(s) Performed: Procedure(s) (LRB): CYSTOSCOPY WITH BIOPSY (N/A) CYSTOSCOPY WITH RETROGRADE PYELOGRAM (Bilateral)  Patient Location: PACU  Anesthesia Type: General  Level of Consciousness: awake, alert  and oriented  Airway & Oxygen Therapy: Patient Spontanous Breathing and Patient connected to face mask oxygen  Post-op Assessment: Report given to PACU RN and Post -op Vital signs reviewed and stable  Post vital signs: Reviewed and stable  Complications: No apparent anesthesia complications

## 2013-01-25 ENCOUNTER — Encounter (HOSPITAL_BASED_OUTPATIENT_CLINIC_OR_DEPARTMENT_OTHER): Payer: Self-pay | Admitting: Urology

## 2013-01-25 NOTE — Addendum Note (Signed)
Addendum created 01/25/13 1246 by Mechele Claude, CRNA   Modules edited: Anesthesia Responsible Staff

## 2013-01-25 NOTE — Anesthesia Postprocedure Evaluation (Signed)
Anesthesia Post Note  Patient: [name]  Procedure(s) Performed: Procedure(s) (LRB): CYSTOSCOPY WITH BIOPSY (N/A) CYSTOSCOPY WITH RETROGRADE PYELOGRAM (Bilateral)  Anesthesia type: General  Patient location: PACU  Post pain: Pain level controlled  Post assessment: Post-op Vital signs reviewed  Last Vitals:  Filed Vitals:   01/24/13 1530  BP: 128/60  Pulse: 70  Temp:   Resp:     Post vital signs: Reviewed  Level of consciousness: sedated  Complications: No apparent anesthesia complications

## 2013-02-11 ENCOUNTER — Inpatient Hospital Stay (HOSPITAL_COMMUNITY)
Admission: EM | Admit: 2013-02-11 | Discharge: 2013-02-14 | DRG: 684 | Disposition: A | Discharge status: Home / Self Care | Payer: BC Managed Care – PPO | Attending: Internal Medicine | Admitting: Internal Medicine

## 2013-02-11 ENCOUNTER — Encounter (HOSPITAL_COMMUNITY): Payer: Self-pay | Admitting: Emergency Medicine

## 2013-02-11 DIAGNOSIS — Z888 Allergy status to other drugs, medicaments and biological substances status: Secondary | ICD-10-CM

## 2013-02-11 DIAGNOSIS — N179 Acute kidney failure, unspecified: Principal | ICD-10-CM | POA: Diagnosis present

## 2013-02-11 DIAGNOSIS — R1013 Epigastric pain: Secondary | ICD-10-CM | POA: Diagnosis present

## 2013-02-11 DIAGNOSIS — N329 Bladder disorder, unspecified: Secondary | ICD-10-CM | POA: Diagnosis present

## 2013-02-11 DIAGNOSIS — E860 Dehydration: Secondary | ICD-10-CM | POA: Diagnosis present

## 2013-02-11 DIAGNOSIS — Z886 Allergy status to analgesic agent status: Secondary | ICD-10-CM

## 2013-02-11 DIAGNOSIS — G8929 Other chronic pain: Secondary | ICD-10-CM | POA: Diagnosis present

## 2013-02-11 DIAGNOSIS — K219 Gastro-esophageal reflux disease without esophagitis: Secondary | ICD-10-CM | POA: Diagnosis present

## 2013-02-11 DIAGNOSIS — A419 Sepsis, unspecified organism: Secondary | ICD-10-CM

## 2013-02-11 DIAGNOSIS — I951 Orthostatic hypotension: Secondary | ICD-10-CM | POA: Diagnosis present

## 2013-02-11 DIAGNOSIS — Z79899 Other long term (current) drug therapy: Secondary | ICD-10-CM

## 2013-02-11 DIAGNOSIS — N390 Urinary tract infection, site not specified: Secondary | ICD-10-CM | POA: Diagnosis present

## 2013-02-11 DIAGNOSIS — R102 Pelvic and perineal pain unspecified side: Secondary | ICD-10-CM | POA: Diagnosis present

## 2013-02-11 DIAGNOSIS — R509 Fever, unspecified: Secondary | ICD-10-CM | POA: Diagnosis not present

## 2013-02-11 DIAGNOSIS — N289 Disorder of kidney and ureter, unspecified: Secondary | ICD-10-CM

## 2013-02-11 DIAGNOSIS — I959 Hypotension, unspecified: Secondary | ICD-10-CM | POA: Diagnosis present

## 2013-02-11 DIAGNOSIS — Z881 Allergy status to other antibiotic agents status: Secondary | ICD-10-CM

## 2013-02-11 DIAGNOSIS — N949 Unspecified condition associated with female genital organs and menstrual cycle: Secondary | ICD-10-CM | POA: Diagnosis present

## 2013-02-11 HISTORY — DX: Disorder of kidney and ureter, unspecified: N28.9

## 2013-02-11 HISTORY — DX: Gastro-esophageal reflux disease without esophagitis: K21.9

## 2013-02-11 LAB — CBC WITH DIFFERENTIAL/PLATELET
BASOS ABS: 0 10*3/uL (ref 0.0–0.1)
Basophils Relative: 0 % (ref 0–1)
EOS PCT: 0 % (ref 0–5)
Eosinophils Absolute: 0 10*3/uL (ref 0.0–0.7)
HEMATOCRIT: 35.3 % — AB (ref 36.0–46.0)
Hemoglobin: 12 g/dL (ref 12.0–15.0)
LYMPHS PCT: 3 % — AB (ref 12–46)
Lymphs Abs: 0.5 10*3/uL — ABNORMAL LOW (ref 0.7–4.0)
MCH: 32 pg (ref 26.0–34.0)
MCHC: 34 g/dL (ref 30.0–36.0)
MCV: 94.1 fL (ref 78.0–100.0)
MONO ABS: 1 10*3/uL (ref 0.1–1.0)
Monocytes Relative: 6 % (ref 3–12)
Neutro Abs: 15.3 10*3/uL — ABNORMAL HIGH (ref 1.7–7.7)
Neutrophils Relative %: 91 % — ABNORMAL HIGH (ref 43–77)
PLATELETS: 215 10*3/uL (ref 150–400)
RBC: 3.75 MIL/uL — ABNORMAL LOW (ref 3.87–5.11)
RDW: 11.6 % (ref 11.5–15.5)
WBC: 16.8 10*3/uL — AB (ref 4.0–10.5)

## 2013-02-11 LAB — BASIC METABOLIC PANEL
BUN: 22 mg/dL (ref 6–23)
CALCIUM: 9.1 mg/dL (ref 8.4–10.5)
CO2: 21 meq/L (ref 19–32)
CREATININE: 1.06 mg/dL (ref 0.50–1.10)
Chloride: 101 mEq/L (ref 96–112)
GFR calc Af Amer: 65 mL/min — ABNORMAL LOW (ref >90)
GFR, EST NON AFRICAN AMERICAN: 56 mL/min — AB (ref >90)
Glucose, Bld: 160 mg/dL — ABNORMAL HIGH (ref 70–99)
Potassium: 3.6 mEq/L — ABNORMAL LOW (ref 3.7–5.3)
SODIUM: 138 meq/L (ref 137–147)

## 2013-02-11 LAB — CG4 I-STAT (LACTIC ACID): Lactic Acid, Venous: 0.87 mmol/L (ref 0.5–2.2)

## 2013-02-11 MED ORDER — SODIUM CHLORIDE 0.9 % IV BOLUS (SEPSIS)
1000.0000 mL | Freq: Once | INTRAVENOUS | Status: AC
  Administered 2013-02-11: 1000 mL via INTRAVENOUS

## 2013-02-11 MED ORDER — SODIUM CHLORIDE 0.9 % IV SOLN
INTRAVENOUS | Status: DC
  Administered 2013-02-12: 06:00:00 via INTRAVENOUS
  Administered 2013-02-12: 125 mL/h via INTRAVENOUS
  Administered 2013-02-12: 21:00:00 via INTRAVENOUS
  Administered 2013-02-12: 125 mL/h via INTRAVENOUS
  Administered 2013-02-12 – 2013-02-13 (×3): via INTRAVENOUS
  Administered 2013-02-13: 125 mL/h via INTRAVENOUS

## 2013-02-11 MED ORDER — DEXTROSE 5 % IV SOLN
1.0000 g | Freq: Once | INTRAVENOUS | Status: AC
  Administered 2013-02-11: 1 g via INTRAVENOUS
  Filled 2013-02-11: qty 10

## 2013-02-11 MED ORDER — ONDANSETRON HCL 4 MG/2ML IJ SOLN
4.0000 mg | Freq: Once | INTRAMUSCULAR | Status: AC
  Administered 2013-02-11: 4 mg via INTRAVENOUS
  Filled 2013-02-11: qty 2

## 2013-02-11 NOTE — ED Notes (Signed)
Lactic Acid given to Dr. Leonides Schanz.

## 2013-02-11 NOTE — ED Provider Notes (Signed)
CSN: [id number]     Arrival date & time 02/11/13  2105 History   First MD Initiated Contact with Patient 02/11/13 2203     Chief Complaint  Patient presents with  . Pelvic Pain   (Consider location/radiation/quality/duration/timing/severity/associated sxs/prior Treatment) Patient is a 63 y.o. female presenting with pelvic pain. The history is provided by the patient and the spouse. No language interpreter was used.  Pelvic Pain The current episode started 1 to 4 weeks ago. Associated symptoms include chills. Pertinent negatives include no chest pain or fever. Associated symptoms comments: The patient was referred to urology (Dr. Jasmine December) after finding of microscopic hematuria by PCP on routine exam. A bladder lesion was discovered by Dr. Jasmine December and patient underwent a bladder biopsy on 01/24/13, which she reports was benign. She has had continuous pain since the procedure and has followed up with Dr. Jasmine December for this. She has finished a course of antibiotics, is currently on oxybutynin, pyridium and hydrocodone. She states that tonight she had onset of chills, nausea and generalized malaise. She felt lightheaded while ambulating. No bleeding with urination, no known fever. .    Past Medical History  Diagnosis Date  . Lesion of bladder   . Hematuria   . Heart murmur    Past Surgical History  Procedure Laterality Date  . Tubal ligation    . Appendectomy  03-09-2008  . Open hiatal hernia repair  1985  . Laparoscopic appendectomy N/A 2011  . Cholecystectomy  1990  . Cholecystectomy    . Tubal ligation Bilateral   . Cystoscopy with biopsy N/A 01/24/2013    Procedure: CYSTOSCOPY WITH BIOPSY;  Surgeon: Molli Hazard, MD;  Location: [facility];  Service: Urology;  Laterality: N/A;  . Cystoscopy w/ retrogrades Bilateral 01/24/2013    Procedure: CYSTOSCOPY WITH RETROGRADE PYELOGRAM;  Surgeon: Molli Hazard, MD;  Location: [facility];  Service:  Urology;  Laterality: Bilateral;   Family History  Problem Relation Age of Onset  . COPD Mother   . Cancer Father    History  Substance Use Topics  . Smoking status: Never Smoker   . Smokeless tobacco: Not on file  . Alcohol Use: No   OB History   Grav Para Term Preterm Abortions TAB SAB Ect Mult Living                 Review of Systems  Constitutional: Positive for chills. Negative for fever.  Respiratory: Negative.  Negative for shortness of breath.   Cardiovascular: Negative.  Negative for chest pain.  Gastrointestinal: Negative.   Genitourinary: Positive for dysuria and pelvic pain. Negative for hematuria and flank pain.  Musculoskeletal: Negative.  Negative for back pain.  Neurological: Positive for light-headedness.    Allergies  Asa; Ciprofloxacin hcl; and Tagamet  Home Medications   Current Outpatient Rx  Name  Route  Sig  Dispense  Refill  . hyoscyamine (LEVSIN, ANASPAZ) 0.125 MG tablet   Oral   Take 1 tablet (0.125 mg total) by mouth every 4 (four) hours as needed (bladder spasms).   40 tablet   4   . oxybutynin (DITROPAN-XL) 5 MG 24 hr tablet   Oral   Take 1 tablet by mouth every morning.         . phenazopyridine (PYRIDIUM) 200 MG tablet   Oral   Take 1 tablet (200 mg total) by mouth 3 (three) times daily as needed for pain.   30 tablet   6   .  senna-docusate (SENOKOT S) 8.6-50 MG per tablet   Oral   Take 1 tablet by mouth 2 (two) times daily.   60 tablet   0   . HYDROcodone-acetaminophen (NORCO/VICODIN) 5-325 MG per tablet   Oral   Take 1-2 tablets by mouth every 4 (four) hours as needed for moderate pain.   25 tablet   0    BP 97/41  Pulse 76  Temp(Src) 98.1 F (36.7 C) (Oral)  Resp 19  SpO2 98% Physical Exam  Constitutional: She is oriented to person, place, and time. She appears well-developed and well-nourished.  Neck: Normal range of motion.  Pulmonary/Chest: Effort normal.  Abdominal:  Suprapubic tenderness predominantly,  within generalized tenderness of entire abdomen. Soft.  Neurological: She is alert and oriented to person, place, and time.  Skin: Skin is warm and dry.    ED Course  Procedures (including critical care time) Labs Review Labs Reviewed  URINALYSIS, ROUTINE W REFLEX MICROSCOPIC  CBC WITH DIFFERENTIAL  BASIC METABOLIC PANEL   Results for orders placed during the hospital encounter of 02/11/13  URINALYSIS, ROUTINE W REFLEX MICROSCOPIC      Result Value Range   Color, Urine ORANGE (*) YELLOW   APPearance CLEAR  CLEAR   Specific Gravity, Urine 1.012  1.005 - 1.030   pH 6.5  5.0 - 8.0   Glucose, UA NEGATIVE  NEGATIVE mg/dL   Hgb urine dipstick NEGATIVE  NEGATIVE   Bilirubin Urine NEGATIVE  NEGATIVE   Ketones, ur 15 (*) NEGATIVE mg/dL   Protein, ur NEGATIVE  NEGATIVE mg/dL   Urobilinogen, UA 1.0  0.0 - 1.0 mg/dL   Nitrite POSITIVE (*) NEGATIVE   Leukocytes, UA TRACE (*) NEGATIVE  CBC WITH DIFFERENTIAL      Result Value Range   WBC 16.8 (*) 4.0 - 10.5 K/uL   RBC 3.75 (*) 3.87 - 5.11 MIL/uL   Hemoglobin 12.0  12.0 - 15.0 g/dL   HCT 35.3 (*) 36.0 - 46.0 %   MCV 94.1  78.0 - 100.0 fL   MCH 32.0  26.0 - 34.0 pg   MCHC 34.0  30.0 - 36.0 g/dL   RDW 11.6  11.5 - 15.5 %   Platelets 215  150 - 400 K/uL   Neutrophils Relative % 91 (*) 43 - 77 %   Neutro Abs 15.3 (*) 1.7 - 7.7 K/uL   Lymphocytes Relative 3 (*) 12 - 46 %   Lymphs Abs 0.5 (*) 0.7 - 4.0 K/uL   Monocytes Relative 6  3 - 12 %   Monocytes Absolute 1.0  0.1 - 1.0 K/uL   Eosinophils Relative 0  0 - 5 %   Eosinophils Absolute 0.0  0.0 - 0.7 K/uL   Basophils Relative 0  0 - 1 %   Basophils Absolute 0.0  0.0 - 0.1 K/uL  BASIC METABOLIC PANEL      Result Value Range   Sodium 138  137 - 147 mEq/L   Potassium 3.6 (*) 3.7 - 5.3 mEq/L   Chloride 101  96 - 112 mEq/L   CO2 21  19 - 32 mEq/L   Glucose, Bld 160 (*) 70 - 99 mg/dL   BUN 22  6 - 23 mg/dL   Creatinine, Ser 1.06  0.50 - 1.10 mg/dL   Calcium 9.1  8.4 - 10.5 mg/dL   GFR  calc non Af Amer 56 (*) >90 mL/min   GFR calc Af Amer 65 (*) >90 mL/min  URINE MICROSCOPIC-ADD ON  Result Value Range   WBC, UA 7-10  <3 WBC/hpf   Bacteria, UA RARE  RARE  CG4 I-STAT (LACTIC ACID)      Result Value Range   Lactic Acid, Venous 0.87  0.5 - 2.2 mmol/L    Imaging Review No results found.  EKG Interpretation   None       MDM  No diagnosis found. 1. Urosepsis  Patient arrives hypotensive with leukocytosis and evidence UTI. Blood pressure responds to IV fluids. Plan is to admit patient for continued hydration, IV antibiotics. Discussed with Dr. Louis Meckel (urology) who will provide consultation.    Dewaine Oats, PA-C 02/12/13 [phone]

## 2013-02-11 NOTE — ED Provider Notes (Signed)
Medical screening examination/treatment/procedure(s) were conducted as a shared visit with non-physician practitioner(s) and myself.  I personally evaluated the patient during the encounter.  EKG Interpretation   None       Patient is a 63-year-old female who recently underwent biopsy of a bladder lesion on 01/24/2013 by Dr. Jasmine December who presents emergency department with dysuria and dizziness. Patient has finished a week course of Keflex without relief. She denies any vomiting but has had nausea. No diarrhea. She's had chills but no documented fever. On exam, patient is hypotensive with a systolic blood in the 56-38L (normally is 120s) and is diffusely tender to palpation across her lower abdomen. No peritoneal signs, she has a nonsurgical abdomen. Patient is a leukocytosis of 16.8. Concern for urinary tract infection, urosepsis. Will check blood cultures, lactate and ceftriaxone. Have discussed with patient she will need to be admitted.  [place], DO 02/11/13 2335

## 2013-02-11 NOTE — ED Notes (Signed)
Pt arrived to the ED with a complaint of pelvic pain.  Pt had bladder surgery on [id number].  Pt states she has been having difficulty urinating. Pt states sometimes her urination has a burning sensation to it.  Pt is also feeling dizzy and light headed.  Pt states her pain is in her lower pelvic area.

## 2013-02-12 ENCOUNTER — Encounter (HOSPITAL_COMMUNITY): Payer: Self-pay | Admitting: Internal Medicine

## 2013-02-12 DIAGNOSIS — A419 Sepsis, unspecified organism: Secondary | ICD-10-CM

## 2013-02-12 DIAGNOSIS — Z888 Allergy status to other drugs, medicaments and biological substances status: Secondary | ICD-10-CM | POA: Diagnosis not present

## 2013-02-12 DIAGNOSIS — N179 Acute kidney failure, unspecified: Principal | ICD-10-CM

## 2013-02-12 DIAGNOSIS — K219 Gastro-esophageal reflux disease without esophagitis: Secondary | ICD-10-CM | POA: Diagnosis present

## 2013-02-12 DIAGNOSIS — N289 Disorder of kidney and ureter, unspecified: Secondary | ICD-10-CM

## 2013-02-12 DIAGNOSIS — N949 Unspecified condition associated with female genital organs and menstrual cycle: Secondary | ICD-10-CM | POA: Diagnosis present

## 2013-02-12 DIAGNOSIS — N329 Bladder disorder, unspecified: Secondary | ICD-10-CM

## 2013-02-12 DIAGNOSIS — G8929 Other chronic pain: Secondary | ICD-10-CM | POA: Diagnosis present

## 2013-02-12 DIAGNOSIS — N390 Urinary tract infection, site not specified: Secondary | ICD-10-CM

## 2013-02-12 DIAGNOSIS — R102 Pelvic and perineal pain: Secondary | ICD-10-CM | POA: Diagnosis present

## 2013-02-12 DIAGNOSIS — Z881 Allergy status to other antibiotic agents status: Secondary | ICD-10-CM | POA: Diagnosis not present

## 2013-02-12 DIAGNOSIS — R1013 Epigastric pain: Secondary | ICD-10-CM | POA: Diagnosis present

## 2013-02-12 DIAGNOSIS — E860 Dehydration: Secondary | ICD-10-CM | POA: Diagnosis present

## 2013-02-12 DIAGNOSIS — Z886 Allergy status to analgesic agent status: Secondary | ICD-10-CM | POA: Diagnosis not present

## 2013-02-12 DIAGNOSIS — I959 Hypotension, unspecified: Secondary | ICD-10-CM

## 2013-02-12 DIAGNOSIS — Z79899 Other long term (current) drug therapy: Secondary | ICD-10-CM | POA: Diagnosis not present

## 2013-02-12 DIAGNOSIS — I951 Orthostatic hypotension: Secondary | ICD-10-CM | POA: Diagnosis present

## 2013-02-12 DIAGNOSIS — R509 Fever, unspecified: Secondary | ICD-10-CM | POA: Diagnosis present

## 2013-02-12 HISTORY — DX: Disorder of kidney and ureter, unspecified: N28.9

## 2013-02-12 LAB — BASIC METABOLIC PANEL
BUN: 13 mg/dL (ref 6–23)
CALCIUM: 7.7 mg/dL — AB (ref 8.4–10.5)
CHLORIDE: 111 meq/L (ref 96–112)
CO2: 21 meq/L (ref 19–32)
Creatinine, Ser: 0.86 mg/dL (ref 0.50–1.10)
GFR calc Af Amer: 83 mL/min — ABNORMAL LOW (ref >90)
GFR calc non Af Amer: 72 mL/min — ABNORMAL LOW (ref >90)
GLUCOSE: 108 mg/dL — AB (ref 70–99)
POTASSIUM: 4.5 meq/L (ref 3.7–5.3)
SODIUM: 141 meq/L (ref 137–147)

## 2013-02-12 LAB — URINE MICROSCOPIC-ADD ON

## 2013-02-12 LAB — URINALYSIS, ROUTINE W REFLEX MICROSCOPIC
Bilirubin Urine: NEGATIVE
Glucose, UA: NEGATIVE mg/dL
HGB URINE DIPSTICK: NEGATIVE
Ketones, ur: 15 mg/dL — AB
Nitrite: POSITIVE — AB
PROTEIN: NEGATIVE mg/dL
Specific Gravity, Urine: 1.012 (ref 1.005–1.030)
Urobilinogen, UA: 1 mg/dL (ref 0.0–1.0)
pH: 6.5 (ref 5.0–8.0)

## 2013-02-12 LAB — CBC
HEMATOCRIT: 29.5 % — AB (ref 36.0–46.0)
HEMOGLOBIN: 10 g/dL — AB (ref 12.0–15.0)
MCH: 32.3 pg (ref 26.0–34.0)
MCHC: 33.9 g/dL (ref 30.0–36.0)
MCV: 95.2 fL (ref 78.0–100.0)
PLATELETS: 167 10*3/uL (ref 150–400)
RBC: 3.1 MIL/uL — AB (ref 3.87–5.11)
RDW: 11.8 % (ref 11.5–15.5)
WBC: 14.3 10*3/uL — ABNORMAL HIGH (ref 4.0–10.5)

## 2013-02-12 LAB — MRSA PCR SCREENING: MRSA by PCR: NEGATIVE

## 2013-02-12 MED ORDER — SODIUM CHLORIDE 0.9 % IV BOLUS (SEPSIS)
1000.0000 mL | Freq: Once | INTRAVENOUS | Status: AC
  Administered 2013-02-12: 1000 mL via INTRAVENOUS

## 2013-02-12 MED ORDER — ONDANSETRON HCL 4 MG PO TABS
4.0000 mg | ORAL_TABLET | Freq: Four times a day (QID) | ORAL | Status: DC | PRN
  Administered 2013-02-13: 4 mg via ORAL
  Filled 2013-02-12: qty 1

## 2013-02-12 MED ORDER — OXYCODONE HCL 5 MG PO TABS
5.0000 mg | ORAL_TABLET | ORAL | Status: DC | PRN
  Administered 2013-02-12: 5 mg via ORAL
  Filled 2013-02-12: qty 1

## 2013-02-12 MED ORDER — PHENAZOPYRIDINE HCL 200 MG PO TABS
200.0000 mg | ORAL_TABLET | Freq: Three times a day (TID) | ORAL | Status: DC
  Administered 2013-02-12 – 2013-02-14 (×8): 200 mg via ORAL
  Filled 2013-02-12 (×10): qty 1

## 2013-02-12 MED ORDER — ALUM & MAG HYDROXIDE-SIMETH 200-200-20 MG/5ML PO SUSP
30.0000 mL | Freq: Four times a day (QID) | ORAL | Status: DC | PRN
  Administered 2013-02-12 – 2013-02-14 (×4): 30 mL via ORAL
  Filled 2013-02-12 (×4): qty 30

## 2013-02-12 MED ORDER — SODIUM CHLORIDE 0.9 % IV SOLN
INTRAVENOUS | Status: DC
  Administered 2013-02-12: 06:00:00 via INTRAVENOUS

## 2013-02-12 MED ORDER — OXYCODONE HCL 5 MG PO TABS
5.0000 mg | ORAL_TABLET | ORAL | Status: DC | PRN
  Administered 2013-02-13: 10 mg via ORAL
  Filled 2013-02-12: qty 2

## 2013-02-12 MED ORDER — ACETAMINOPHEN 650 MG RE SUPP: 650.0000 mg | Freq: Four times a day (QID) | RECTAL | Status: DC | PRN

## 2013-02-12 MED ORDER — HYOSCYAMINE SULFATE 0.125 MG PO TABS
0.1250 mg | ORAL_TABLET | ORAL | Status: DC | PRN
  Filled 2013-02-12: qty 1

## 2013-02-12 MED ORDER — DEXTROSE 5 % IV SOLN
1.0000 g | INTRAVENOUS | Status: DC
  Administered 2013-02-12 – 2013-02-13 (×2): 1 g via INTRAVENOUS
  Filled 2013-02-12 (×3): qty 10

## 2013-02-12 MED ORDER — ACETAMINOPHEN 325 MG PO TABS
650.0000 mg | ORAL_TABLET | Freq: Four times a day (QID) | ORAL | Status: DC | PRN
Start: 2013-02-12 — End: 2013-02-14
  Administered 2013-02-12: 650 mg via ORAL
  Filled 2013-02-12: qty 2

## 2013-02-12 MED ORDER — ZOLPIDEM TARTRATE 5 MG PO TABS
5.0000 mg | ORAL_TABLET | Freq: Every evening | ORAL | Status: DC | PRN
  Administered 2013-02-12: 5 mg via ORAL
  Filled 2013-02-12: qty 1

## 2013-02-12 MED ORDER — ENOXAPARIN SODIUM 40 MG/0.4ML ~~LOC~~ SOLN
40.0000 mg | Freq: Every day | SUBCUTANEOUS | Status: DC
  Administered 2013-02-12 – 2013-02-14 (×3): 40 mg via SUBCUTANEOUS
  Filled 2013-02-12 (×3): qty 0.4

## 2013-02-12 MED ORDER — HYDROMORPHONE HCL PF 1 MG/ML IJ SOLN: 0.5000 mg | INTRAMUSCULAR | Status: DC | PRN

## 2013-02-12 MED ORDER — OXYBUTYNIN CHLORIDE ER 5 MG PO TB24
5.0000 mg | ORAL_TABLET | Freq: Every morning | ORAL | Status: DC
  Filled 2013-02-12 (×3): qty 1

## 2013-02-12 MED ORDER — ONDANSETRON HCL 4 MG/2ML IJ SOLN: 4.0000 mg | Freq: Four times a day (QID) | INTRAMUSCULAR | Status: DC | PRN

## 2013-02-12 MED ORDER — SODIUM CHLORIDE 0.9 % IV SOLN: INTRAVENOUS | Status: DC

## 2013-02-12 MED ORDER — SENNOSIDES-DOCUSATE SODIUM 8.6-50 MG PO TABS
1.0000 | ORAL_TABLET | Freq: Two times a day (BID) | ORAL | Status: DC
  Administered 2013-02-12: 1 via ORAL
  Filled 2013-02-12 (×5): qty 1

## 2013-02-12 NOTE — Progress Notes (Signed)
Pt. Arrived to floor from ICU. Family at bedside. Pt. Alert and oriented x 4. No respiratory distress noted. Reviewed MD orders with pt. Pt. watched safety video.

## 2013-02-12 NOTE — ED Provider Notes (Signed)
63-year-old female who had presented after syncopal episode was noted to be hypotensive. She had a bladder lesion biopsied about 2 weeks ago and has been on narcotic painkillers because of ongoing pelvic pain. Blood pressure was initially 80 systolic but improved to 888 following IV fluids. Blood pressures come back down to 91 systolic. She is noted to have a leukocytosis with left shift. Urinalysis only shows 7-10 WBCs. Positive nitrate is probably secondary to color interference from Pyridium. She appears ill but not toxic. Abdomen has tenderness across the suprapubic area. Case is discussed with Dr. Arnoldo Morale of [facility] who agrees to admit the patient. Because of borderline blood pressure, she is placed in the step down unit.  Medical screening examination/treatment/procedure(s) were conducted as a shared visit with non-physician practitioner(s) and myself.  I personally evaluated the patient during the encounter.    Delora Fuel, MD 91/69/45 0388

## 2013-02-12 NOTE — Consult Note (Signed)
I've been asked to see the patient by Dr. Pryor Curia, D.O. for evaluation of pelvic pain and pyuria.  History of present illness: This is a [age]-year-old female who is followed by Dr. Jasmine December of urology for hematuria, urinary urgency, and pelvic pain. She had a bladder biopsy approximately 2 weeks ago for a suspicious area in the bladder which returned as acute inflammation. She continues to have pelvic pain and urgency. The patient was given a week of Keflex, but her urine cultures were negative. She's had several cultures previously that grew less than 10,000 bacteria, and treating her symptoms antibiotics has not improved her pain. She had a CT scan performed in January which revealed no significant GU abnormality. She had been taking oxybutynin 5 mg twice daily but relates that this has caused her dizziness, upset stomach, and weakness. She presents to the ER today with nausea, diffuse lower, pain, pelvic pain and lightheadedness. She was found to have a systolic blood pressure in the 80s upon arrival. A urinalysis was obtained and was positive for nitrites, there were 7-10 white blood cells. Given her findings there was concern for urosepsis.  Review of systems: 12 point conference of review of systems was obtained and is negative MS otherwise stated in the history of present illness.  Patient Active Problem List   Diagnosis Date Noted  . Hypotension 02/12/2013   Past Medical History  Diagnosis Date  . Lesion of bladder   . Hematuria   . Heart murmur    No current facility-administered medications on file prior to encounter.   Current Outpatient Prescriptions on File Prior to Encounter  Medication Sig Dispense Refill  . hyoscyamine (LEVSIN, ANASPAZ) 0.125 MG tablet Take 1 tablet (0.125 mg total) by mouth every 4 (four) hours as needed (bladder spasms).  40 tablet  4  . phenazopyridine (PYRIDIUM) 200 MG tablet Take 1 tablet (200 mg total) by mouth 3 (three) times daily as needed for  pain.  30 tablet  6  . senna-docusate (SENOKOT S) 8.6-50 MG per tablet Take 1 tablet by mouth 2 (two) times daily.  60 tablet  0  . HYDROcodone-acetaminophen (NORCO/VICODIN) 5-325 MG per tablet Take 1-2 tablets by mouth every 4 (four) hours as needed for moderate pain.  25 tablet  0   History  Substance Use Topics  . Smoking status: Never Smoker   . Smokeless tobacco: Not on file  . Alcohol Use: No   Family History  Problem Relation Age of Onset  . COPD Mother   . Cancer Father    PE: Filed Vitals:   02/11/13 2241 02/11/13 2302 02/11/13 2325 02/11/13 2341  BP: 97/41 103/46 101/50   Pulse: 76 80    Temp:    99.9 F (37.7 C)  TempSrc:    Rectal  Resp: 19 21    SpO2: 98% 100%     NAD Alert and oriented x3 Atraumatic normocephalic head Unlabored breathing Regular heart rate Abdomen flat, diffusely tender to palpation lower abdomen, lateral inguinal pain that is tender to palpation. Significant suprapubic tenderness. No CVA tenderness. Extremities are symmetric Neurologically grossly intact   Recent Labs  02/11/13 2231  WBC 16.8*  HGB 12.0  HCT 35.3*    Recent Labs  02/11/13 2231  NA 138  K 3.6*  CL 101  CO2 21  GLUCOSE 160*  BUN 22  CREATININE 1.06  CALCIUM 9.1   No results found for this basename: LABPT, INR,  in the last 72 hours No results  found for this basename: LABURIN,  in the last 72 hours No results found for this or any previous visit.   Results for DIANN, BANGERTER (MRN [id number]) as of 02/12/2013 02:09  Ref. Range 02/12/2013 00:19  Color, Urine Latest Range: YELLOW  ORANGE (A)  APPearance Latest Range: CLEAR  CLEAR  Specific Gravity, Urine Latest Range: 1.005-1.030  1.012  pH Latest Range: 5.0-8.0  6.5  Glucose Latest Range: NEGATIVE mg/dL NEGATIVE  Bilirubin Urine Latest Range: NEGATIVE  NEGATIVE  Ketones, ur Latest Range: NEGATIVE mg/dL 15 (A)  Protein Latest Range: NEGATIVE mg/dL NEGATIVE  Urobilinogen, UA Latest Range: 0.0-1.0 mg/dL 1.0   Nitrite Latest Range: NEGATIVE  POSITIVE (A)  Leukocytes, UA Latest Range: NEGATIVE  TRACE (A)  Hgb urine dipstick Latest Range: NEGATIVE  NEGATIVE  WBC, UA Latest Range: <3 WBC/hpf 7-10  Bacteria, UA Latest Range: RARE  RARE   No imaging was obtained Impression: Patient is hypotensive with abdominal pain and pelvic pain, her UA is mildly suspicious for infection although I would be reluctant to blame her symptoms on a urinary tract infection. She has been responsive to IV fluid, I suspect dehydration it is planning a big role in her symptoms. Her pelvic pain is likely a combination of chronic pain and pain associated with her recent procedure.  Recommendations: Followup urine cultures and treat if positive. Continue with the IV fluid. Consider BNO suppositories for bladder spasms and pelvic pain. Pyridium for dysuria when necessary. Otherwise, would treat her pelvic pain with nonsteroidal anti-inflammatories. Ensure the patient does not develop constipation. However, she has had an extensive workup and there is been no significant urologic abnormality noted. The patient has scheduled followup with Dr. Jasmine December in the urology clinic. We will see the patient while in the hospital on an as-needed basis.

## 2013-02-12 NOTE — Progress Notes (Signed)
[facility] PROGRESS NOTE   VALOR TURBERVILLE [id number] DOB: September 22, 1950 DOA: 02/11/2013 PCP: Redge Gainer, MD  Brief narrative: [name] is an 63 y.o. female with a PMH of microscopic hematuria and bladder mass status post cystoscopy with bladder biopsy on 01/24/13 (treated with a one-week course of Keflex post procedure), pathology showing reactive urothelium, negative for malignancy or dysplasia, who was admitted on 02/12/13 the chief complaint of abdominal pain, fever and chills. Upon initial evaluation in the ED, the patient had hypotension with systolic blood pressures in the 80s and a urinalysis suggestive of UTI.  Assessment/Plan: Principal Problem:   Sepsis with hypotension secondary to UTI Patient was admitted to the SDU, placed on IV fluids, and started on empiric IV Rocephin. Blood pressure stable but still remains slightly low. WBC beginning to improve. Evaluated by urologist on 02/12/13.  Tx to floor. Active Problems:   Lesion of bladder No evidence of malignancy or dysplasia. Status post bladder biopsy 01/24/13. Pathology report showed reactive inflammation.   Pelvic pain Chronic. She has had an extensive evaluation by urology. No significant urologic abnormalities have been found. Seen by urologist 02/12/13 with recommendations to consider BNO suppositories for bladder spasms and pelvic pain, Pyridium for dysuria as needed, and NSAIDs.   Acute kidney injury secondary to dehydration Creatinine is 1.06. Baseline creatinine is 0.8. Continue IV fluids.   DVT prophylaxis Start Lovenox.    Code Status: Full. Family Communication: No family at the bedside. Disposition Plan: Home when stable.   IV access:  Peripheral IV  Medical Consultants:  Dr. Louis Meckel, Urology  Other Consultants:  None  Anti-infectives:  Rocephin 02/12/13--->  HPI/Subjective: [name] had some pelvic/suprapubic pain earlier today, but it has eased off after the RN gave her  some pain medicine.  Denies nausea/vomiting.  Objective: Filed Vitals:   02/12/13 0500 02/12/13 0530 02/12/13 0600 02/12/13 0630  BP: 92/31 100/44 93/33 94/40   Pulse: 60 58 60 65  Temp:      TempSrc:      Resp: 16 18 19 13   Height:      Weight:      SpO2: 98% 98% 99% 98%    Intake/Output Summary (Last 24 hours) at 02/12/13 0729 Last data filed at 02/12/13 0600  Gross per 24 hour  Intake    500 ml  Output    800 ml  Net   -300 ml    Exam: Gen:  NAD Cardiovascular:  RRR, No M/R/G Respiratory:  Lungs CTAB Gastrointestinal:  Abdomen soft, NT/ND, + BS Extremities:  No C/E/C  Data Reviewed: Basic Metabolic Panel:  Recent Labs Lab 02/11/13 2231  NA 138  K 3.6*  CL 101  CO2 21  GLUCOSE 160*  BUN 22  CREATININE 1.06  CALCIUM 9.1   GFR Estimated Creatinine Clearance: 50.3 ml/min (by C-G formula based on Cr of 1.06).  CBC:  Recent Labs Lab 02/11/13 2231 02/12/13 0707  WBC 16.8* 14.3*  NEUTROABS 15.3*  --   HGB 12.0 10.0*  HCT 35.3* 29.5*  MCV 94.1 95.2  PLT 215 167   Microbiology Recent Results (from the past 240 hour(s))  MRSA PCR SCREENING     Status: None   Collection Time    02/12/13  4:39 AM      Result Value Range Status   MRSA by PCR NEGATIVE  NEGATIVE Final   Comment:            The GeneXpert MRSA Assay (FDA  approved for NASAL specimens     only), is one component of a     comprehensive MRSA colonization     surveillance program. It is not     intended to diagnose MRSA     infection nor to guide or     monitor treatment for     MRSA infections.     Procedures and Diagnostic Studies: No results found.  Scheduled Meds: . cefTRIAXone (ROCEPHIN)  IV  1 g Intravenous Q24H  . oxybutynin  5 mg Oral q morning - 10a  . phenazopyridine  200 mg Oral TID WC  . senna-docusate  1 tablet Oral BID  . [DISCONTINUED] sodium chloride   Intravenous STAT   Continuous Infusions: . sodium chloride 125 mL/hr at 02/12/13 0549    Time spent: 35  minutes with > 50% of time discussing current diagnostic test results, clinical impression and plan of care.    LOS: 1 day   Ples Trudel  [facility] Pager [phone].   *Please note that the hospitalists switch teams on Wednesdays. Please call the flow manager at [phone] if you are having difficulty reaching the hospitalist taking care of this patient as she can update you and provide the most up-to-date pager number of provider caring for the patient. If 8PM-8AM, please contact night-coverage at [email or web address], password [facility]  02/12/2013, 7:29 AM

## 2013-02-12 NOTE — H&P (Signed)
[facility] History and Physical  [name] [id number] DOB: [birth year] DOA: 02/11/2013  Referring physician:  EDP PCP: Redge Gainer, MD  Specialists:   Chief Complaint: Fever Chills and Shakes  HPI: [name] is a 63 y.o. female who resents to the ED with complaints of fevers chills and rigors since 5 pm.  She also has had increased pelvic and ABD pain since she had a bladder biopsy performed on 01/24/2013 , and she reports that she had been prescribed oxybutynin, and pyridium but reports that she has not had relief of the pain, and she reports that she has increased ABD pain after taking the Oxybutynin.   She has had nausea but no vomiting, or diarrhea.  In the ED, she was found to be hypotensive with systolic blood pressures in the 80's, and a UA was positive for evidence of a UTI.   Cultures were sent and she was placed on IV Rocephin and she was referred for medical admission.      Review of Systems:  Constitutional: No weight loss, night sweats, +Fevers, +Chills, +Fatigue or +Generalized Weakness HEENT: No headaches, Difficulty swallowing,Tooth/dental problems,Sore throat,  No sneezing, itching, ear ache, nasal congestion, post nasal drip,  Cardio-vascular:  No chest pain, Orthopnea, PND, Edema in lower extremities, Anasarca, dizziness, palpitations  Resp: No shortness of breath, DOE. No cough, No hemoptysis, No wheezing,  No chest wall deformity GI: No heartburn, indigestion, +Abdominal pain, nausea, vomiting, diarrhea, change in bowel habits, loss of appetite  GU: No dysuria, , no urgency or frequency.  No flank pain.  Musculoskeletal: No joint pain or swelling. No decreased range of motion. No back pain.  Neurologic: No syncope, No Seizures, +Muscle Weakness, Paresthesia, Vision disturbance or Loss, No Diplopia, No Vertigo, No Difficulty Walking,  Skin: no rash or lesions. Psych: No change in mood or affect. No depression or anxiety. No memory loss. No confusion or  hallucinations   Past Medical History  Diagnosis Date  . Lesion of bladder   . Hematuria   . Heart murmur   . Renal insufficiency 02/12/2013     Past Surgical History  Procedure Laterality Date  . Tubal ligation    . Appendectomy  03-09-2008  . Open hiatal hernia repair  1985  . Laparoscopic appendectomy N/A 2011  . Cholecystectomy  1990  . Cholecystectomy    . Tubal ligation Bilateral   . Cystoscopy with biopsy N/A 01/24/2013    Procedure: CYSTOSCOPY WITH BIOPSY;  Surgeon: Molli Hazard, MD;  Location: [facility];  Service: Urology;  Laterality: N/A;  . Cystoscopy w/ retrogrades Bilateral 01/24/2013    Procedure: CYSTOSCOPY WITH RETROGRADE PYELOGRAM;  Surgeon: Molli Hazard, MD;  Location: [facility];  Service: Urology;  Laterality: Bilateral;    Prior to Admission medications   Medication Sig Start Date End Date Taking? Authorizing Provider  hyoscyamine (LEVSIN, ANASPAZ) 0.125 MG tablet Take 1 tablet (0.125 mg total) by mouth every 4 (four) hours as needed (bladder spasms). 01/24/13  Yes Molli Hazard, MD  oxybutynin (DITROPAN-XL) 5 MG 24 hr tablet Take 1 tablet by mouth every morning. 02/04/13  Yes Historical Provider, MD  phenazopyridine (PYRIDIUM) 200 MG tablet Take 1 tablet (200 mg total) by mouth 3 (three) times daily as needed for pain. 01/24/13  Yes Molli Hazard, MD  senna-docusate (SENOKOT S) 8.6-50 MG per tablet Take 1 tablet by mouth 2 (two) times daily. 01/24/13  Yes Molli Hazard, MD  HYDROcodone-acetaminophen (  NORCO/VICODIN) 5-325 MG per tablet Take 1-2 tablets by mouth every 4 (four) hours as needed for moderate pain. 01/24/13   Molli Hazard, MD     Allergies  Allergen Reactions  . Asa [Aspirin] Other (See Comments)    Contraindicated for hernia  . Ciprofloxacin Hcl Other (See Comments)    Burning  both legs below knees as if on fire.  . Tagamet [Cimetidine] Rash     Social  History:  reports that she has never smoked. She does not have any smokeless tobacco history on file. She reports that she does not drink alcohol or use illicit drugs.     Family History  Problem Relation Age of Onset  . COPD Mother   . Cancer Father        Physical Exam:  GEN:  Pleasant  Well nourished and Well developed 63 y.o.Caucasian female  examined  and in no acute distress; cooperative with exam Filed Vitals:   02/12/13 0030 02/12/13 0100 02/12/13 0200 02/12/13 0219  BP: 103/49 101/52 102/52 98/48  Pulse:    77  Temp:    98.3 F (36.8 C)  TempSrc:    Oral  Resp: 15 21 20 19   SpO2:    98%   Blood pressure 98/48, pulse 77, temperature 98.3 F (36.8 C), temperature source Oral, resp. rate 19, SpO2 98.00%. PSYCH: She is alert and oriented x4; does not appear anxious does not appear depressed; affect is normal HEENT: Normocephalic and Atraumatic, Mucous membranes pink; PERRLA; EOM intact; Fundi:  Benign;  No scleral icterus, Nares: Patent, Oropharynx: Clear, Fair Dentition, Neck:  FROM, no cervical lymphadenopathy nor thyromegaly or carotid bruit; no JVD; Breasts:: Not examined CHEST WALL: No tenderness CHEST: Normal respiration, clear to auscultation bilaterally HEART: Regular rate and rhythm; no murmurs rubs or gallops BACK: No kyphosis or scoliosis; no CVA tenderness ABDOMEN: Positive Bowel Sounds, soft non-tender; no masses, no organomegaly, no pannus; no intertriginous candida. Rectal Exam: Not done EXTREMITIES: No cyanosis, clubbing or edema; no ulcerations. Genitalia: not examined PULSES: 2+ and symmetric SKIN: Normal hydration no rash or ulceration CNS: Neurologic Examination:  A X O X 3,  Nonfocal Vascular: pulses palpable throughout    Labs on Admission:  Basic Metabolic Panel:  Recent Labs Lab 02/11/13 2231  NA 138  K 3.6*  CL 101  CO2 21  GLUCOSE 160*  BUN 22  CREATININE 1.06  CALCIUM 9.1   Liver Function Tests: No results found for this  basename: AST, ALT, ALKPHOS, BILITOT, PROT, ALBUMIN,  in the last 168 hours No results found for this basename: LIPASE, AMYLASE,  in the last 168 hours No results found for this basename: AMMONIA,  in the last 168 hours CBC:  Recent Labs Lab 02/11/13 2231  WBC 16.8*  NEUTROABS 15.3*  HGB 12.0  HCT 35.3*  MCV 94.1  PLT 215   Cardiac Enzymes: No results found for this basename: CKTOTAL, CKMB, CKMBINDEX, TROPONINI,  in the last 168 hours  BNP (last 3 results) No results found for this basename: PROBNP,  in the last 8760 hours CBG: No results found for this basename: GLUCAP,  in the last 168 hours  Radiological Exams on Admission: No results found.   EKG: Independently reviewed.    Assessment/Plan:   63 y.o. female with  Principal Problem:   Sepsis Active Problems:   Hypotension   Urinary tract infection, site not specified   Lesion of bladder   Pelvic pain   Renal insufficiency  1.    Sepsis-  Cultures sent, placed on IV Rocephin, and IVFs.    2.    Hypotension- due to #1, most likely a Gram Negative bacteremia.  ABxs, and IVFs for fluid Resuscitation.     3.    UTI-  See #1.    4.   Bladder Lesion/Neoplasm-   S/p Bladder Bx  On 01/24/2013.  Please Notify Urology.    5.   Syncope - due to Orthostatic hypotension,  IVFs for fluid Resuscitation.  Monitor.      6.   Pelvic Pain-  Due to #1, #3, and and #4.   IV Dilaudid as BP Tolerates.    7.   Renal Insufficiency-   Monitor BUN/Cr.      8.   DVT prophylaxis with  Lovenox.           Code Status:     FULL CODE  Family Communication:   Husband at Bedside  Disposition Plan:    Inpatient     Time spent:  [facility] Pager [phone]  If 7PM-7AM, please contact night-coverage [email or web address] Password [email or web address] 02/12/2013, 4:59 AM

## 2013-02-13 ENCOUNTER — Encounter (HOSPITAL_COMMUNITY): Payer: Self-pay | Admitting: Internal Medicine

## 2013-02-13 DIAGNOSIS — K219 Gastro-esophageal reflux disease without esophagitis: Secondary | ICD-10-CM

## 2013-02-13 DIAGNOSIS — R1013 Epigastric pain: Secondary | ICD-10-CM

## 2013-02-13 HISTORY — DX: Gastro-esophageal reflux disease without esophagitis: K21.9

## 2013-02-13 LAB — CBC
HCT: 30.3 % — ABNORMAL LOW (ref 36.0–46.0)
Hemoglobin: 10 g/dL — ABNORMAL LOW (ref 12.0–15.0)
MCH: 31.6 pg (ref 26.0–34.0)
MCHC: 33 g/dL (ref 30.0–36.0)
MCV: 95.9 fL (ref 78.0–100.0)
Platelets: 174 K/uL (ref 150–400)
RBC: 3.16 MIL/uL — ABNORMAL LOW (ref 3.87–5.11)
RDW: 11.9 % (ref 11.5–15.5)
WBC: 6.9 K/uL (ref 4.0–10.5)

## 2013-02-13 LAB — URINE CULTURE: Colony Count: 5000

## 2013-02-13 LAB — BASIC METABOLIC PANEL
BUN: 7 mg/dL (ref 6–23)
CALCIUM: 8.1 mg/dL — AB (ref 8.4–10.5)
CO2: 21 meq/L (ref 19–32)
Chloride: 107 mEq/L (ref 96–112)
Creatinine, Ser: 0.83 mg/dL (ref 0.50–1.10)
GFR calc Af Amer: 87 mL/min — ABNORMAL LOW (ref >90)
GFR, EST NON AFRICAN AMERICAN: 75 mL/min — AB (ref >90)
Glucose, Bld: 100 mg/dL — ABNORMAL HIGH (ref 70–99)
Potassium: 3.9 mEq/L (ref 3.7–5.3)
Sodium: 138 mEq/L (ref 137–147)

## 2013-02-13 LAB — LIPASE, BLOOD: Lipase: 26 U/L (ref 11–59)

## 2013-02-13 MED ORDER — GI COCKTAIL ~~LOC~~
30.0000 mL | Freq: Three times a day (TID) | ORAL | Status: DC | PRN
  Administered 2013-02-13: 30 mL via ORAL
  Filled 2013-02-13: qty 30

## 2013-02-13 MED ORDER — PANTOPRAZOLE SODIUM 40 MG PO TBEC
40.0000 mg | DELAYED_RELEASE_TABLET | Freq: Every day | ORAL | Status: DC
  Administered 2013-02-13 – 2013-02-14 (×2): 40 mg via ORAL
  Filled 2013-02-13 (×2): qty 1

## 2013-02-13 MED ORDER — BELLADONNA ALKALOIDS-OPIUM 16.2-60 MG RE SUPP: 1.0000 | Freq: Four times a day (QID) | RECTAL | Status: DC | PRN

## 2013-02-13 NOTE — Progress Notes (Signed)
[facility] PROGRESS NOTE   LYNNLEE REVELS MEQ:[id number] DOB: [birth year] DOA: 02/11/2013 PCP: Redge Gainer, MD  Brief narrative: LARIYAH SHETTERLY is an 63 y.o. female with a PMH of microscopic hematuria and bladder mass status post cystoscopy with bladder biopsy on 01/24/13 (treated with a one-week course of Keflex post procedure), pathology showing reactive urothelium, negative for malignancy or dysplasia, who was admitted on 02/12/13 the chief complaint of abdominal pain, fever and chills. Upon initial evaluation in the ED, the patient had hypotension with systolic blood pressures in the 80s and a urinalysis suggestive of UTI.  Assessment/Plan: Principal Problem:   Sepsis with hypotension secondary to UTI Patient was admitted to the SDU, placed on IV fluids, and started on empiric IV Rocephin. Evaluated by urologist on 02/12/13.  Transferred to floor 02/12/13. Blood pressure remains stable. WBC normalized 02/13/13. Urine cultures still pending. Active Problems:   Abdominal pain / GERD Start PPI.  GI cocktail PRN.  Check lipase.   Lesion of bladder No evidence of malignancy or dysplasia. Status post bladder biopsy 01/24/13. Pathology report showed reactive inflammation.   Pelvic pain Chronic. She has had an extensive evaluation by urology. No significant urologic abnormalities have been found. Seen by urologist 02/12/13 with recommendations to consider BNO suppositories for bladder spasms and pelvic pain, Pyridium for dysuria as needed, and NSAIDs.   Acute kidney injury secondary to dehydration Creatinine is 1.06. Baseline creatinine is 0.8. Creatinine is back to usual baseline values with IV fluids.   DVT prophylaxis Start Lovenox.    Code Status: Full. Family Communication: No family at the bedside. Disposition Plan: Home when stable.   IV access:  Peripheral IV  Medical Consultants:  Dr. Louis Meckel, Urology  Other Consultants:  None  Anti-infectives:  Rocephin  02/12/13--->  HPI/Subjective: [name] complains of epigastric pain radiating to her back and a burning sensation in her mid chest. No diarrhea. Still with intermittent pelvic pain/dysuria. No nausea or vomiting.  Objective: Filed Vitals:   02/12/13 1300 02/12/13 1330 02/12/13 2053 02/13/13 0520  BP: 102/52 107/51 123/51 102/51  Pulse: 70 72 81 77  Temp:  98.1 F (36.7 C) 98.1 F (36.7 C) 98.2 F (36.8 C)  TempSrc:  Oral Oral Oral  Resp: 18 16 16 16   Height:  5\' 5"  (1.651 m)    Weight:  57.7 kg (127 lb 3.3 oz)    SpO2: 95% 99% 99% 98%    Intake/Output Summary (Last 24 hours) at 02/13/13 0736 Last data filed at 02/13/13 2979  Gross per 24 hour  Intake 2774.17 ml  Output   2650 ml  Net 124.17 ml    Exam: Gen:  NAD Cardiovascular:  RRR, No M/R/G Respiratory:  Lungs CTAB Gastrointestinal:  Abdomen soft, NT/ND, + BS Extremities:  No C/E/C  Data Reviewed: Basic Metabolic Panel:  Recent Labs Lab 02/11/13 2231 02/12/13 0707 02/13/13 0413  NA 138 141 138  K 3.6* 4.5 3.9  CL 101 111 107  CO2 21 21 21   GLUCOSE 160* 108* 100*  BUN 22 13 7   CREATININE 1.06 0.86 0.83  CALCIUM 9.1 7.7* 8.1*   GFR Estimated Creatinine Clearance: 64.9 ml/min (by C-G formula based on Cr of 0.83).  CBC:  Recent Labs Lab 02/11/13 2231 02/12/13 0707 02/13/13 0413  WBC 16.8* 14.3* 6.9  NEUTROABS 15.3*  --   --   HGB 12.0 10.0* 10.0*  HCT 35.3* 29.5* 30.3*  MCV 94.1 95.2 95.9  PLT 215 167 174  Microbiology Recent Results (from the past 240 hour(s))  MRSA PCR SCREENING     Status: None   Collection Time    02/12/13  4:39 AM      Result Value Range Status   MRSA by PCR NEGATIVE  NEGATIVE Final   Comment:            The GeneXpert MRSA Assay (FDA     approved for NASAL specimens     only), is one component of a     comprehensive MRSA colonization     surveillance program. It is not     intended to diagnose MRSA     infection nor to guide or     monitor treatment for      MRSA infections.     Procedures and Diagnostic Studies: No results found.  Scheduled Meds: . cefTRIAXone (ROCEPHIN)  IV  1 g Intravenous Q24H  . enoxaparin (LOVENOX) injection  40 mg Subcutaneous Daily  . oxybutynin  5 mg Oral q morning - 10a  . phenazopyridine  200 mg Oral TID WC  . senna-docusate  1 tablet Oral BID   Continuous Infusions: . sodium chloride 125 mL/hr at 02/13/13 0614    Time spent: 25 minutes.    LOS: 2 days   Viet Kemmerer  [facility] Pager [phone].   *Please note that the hospitalists switch teams on Wednesdays. Please call the flow manager at [phone] if you are having difficulty reaching the hospitalist taking care of this patient as she can update you and provide the most up-to-date pager number of provider caring for the patient. If 8PM-8AM, please contact night-coverage at [email or web address], password [email or web address]  02/13/2013, 7:36 AM   Information printed out, explained, and given to the patient:  In an effort to keep you and your family informed about your hospital stay, I am providing you with this information sheet. If you or your family have any questions, please do not hesitate to have the nursing staff page me to set up a meeting time.  ELZORA CULLINS 02/13/2013 2 (Number of days in the hospital)  Treatment team:  Dr. Jacquelynn Cree, Hospitalist (Internist)  Dr. Louis Meckel (urologist)  Active Treatment Issues with Plan: Principal Problem:   Bladder infection with low blood pressure Evaluated by urologist on 02/12/13.  Blood pressure remains stable after receiving IV fluids. WBC (cells that fight infection) normalized 02/13/13. Urine cultures still pending. You are being treated with an antibiotic (Rocephin). Active Problems:   Lesion of bladder No evidence of malignancy or dysplasia on bladder biopsy done 01/24/13. Pathology report showed reactive inflammation.   Pelvic pain No significant urologic abnormalities have been found. Seen by  urologist 02/12/13 with recommendations to consider B&O suppositories for bladder spasms and pelvic pain (ordered, ask for these if you feel you need them), Pyridium for painful urination as needed, and anti-inflammatory medicine (however you have an aspirin allergy).   Dehydration Resolved with IV fluids.   Blood clot prevention On Lovenox.    Anticipated discharge date: 1-2 days depending on when your urine cultures come back.

## 2013-02-14 ENCOUNTER — Encounter (HOSPITAL_COMMUNITY): Payer: Self-pay | Admitting: Radiology

## 2013-02-14 ENCOUNTER — Ambulatory Visit (HOSPITAL_COMMUNITY): Payer: BC Managed Care – PPO

## 2013-02-14 MED ORDER — IOHEXOL 300 MG/ML  SOLN
100.0000 mL | Freq: Once | INTRAMUSCULAR | Status: AC | PRN
  Administered 2013-02-14: 100 mL via INTRAVENOUS

## 2013-02-14 MED ORDER — PANTOPRAZOLE SODIUM 40 MG PO TBEC: 40.0000 mg | DELAYED_RELEASE_TABLET | Freq: Every day | ORAL | Status: DC

## 2013-02-14 MED ORDER — BELLADONNA ALKALOIDS-OPIUM 16.2-60 MG RE SUPP: 1.0000 | Freq: Four times a day (QID) | RECTAL | Status: DC | PRN

## 2013-02-14 MED ORDER — HYDROCODONE-ACETAMINOPHEN 5-325 MG PO TABS: 1.0000 | ORAL_TABLET | ORAL | Status: DC | PRN

## 2013-02-14 NOTE — Progress Notes (Signed)
I shared the results of the CT cystogram w/ the patient. This was negative for abscess or urine leak. I recommend discontinue foley catheter.  I recommend she f/u w/ me on 02/17/13 as scheduled.

## 2013-02-14 NOTE — Discharge Summary (Signed)
Physician Discharge Summary  [name] [id number] DOB: August 30, 1950 DOA: 02/11/2013  PCP: Redge Gainer, MD  Admit date: 02/11/2013 Discharge date: 02/14/2013  Recommendations for Outpatient Follow-up:  1. F/U with Dr. Jasmine December on 02/16/13. 2. F/U final blood culture results (negative to date).  Discharge Diagnoses:  Principal Problem:    Hypotension in the setting of recent bladder instrumentation and pelvic pain Active Problems:    Urinary tract infection, site not specified    Lesion of bladder    Pelvic pain    Acute kidney injury    Dehydration    Abdominal pain, epigastric    GERD (gastroesophageal reflux disease)   Discharge Condition: Improved.  Diet recommendation: Regular.  History of present illness:  [name] is an 63 y.o. female with a PMH of microscopic hematuria and bladder mass status post cystoscopy with bladder biopsy on 01/24/13 (treated with a one-week course of Keflex post procedure), pathology showing reactive urothelium, negative for malignancy or dysplasia, who was admitted on 02/12/13 the chief complaint of abdominal pain, fever and chills. Upon initial evaluation in the ED, the patient had hypotension with systolic blood pressures in the 80s and a urinalysis suggestive of UTI.  Hospital Course by problem:  Principal Problem:  Hypotension in the setting of recent bladder instrumentation and pelvic pain  Patient was admitted to the SDU, placed on IV fluids, and started on empiric IV Rocephin due to concerns for sepsis. Evaluated by urologist on 02/12/13. Transferred to floor 02/12/13. Blood pressure remained stable. WBC normalized 02/13/13. Empiric Rocephin D/C'd 02/14/13 as urine cultures were negative. Underwent CT cystography for further evaluation of pelvic pain with no abnormalities demonstrated.  Pelvic pain improved at discharge, will follow up with Dr. Jasmine December 02/16/13.  Active Problems:  Abdominal pain / GERD  Symptoms improved on PPI.  Lipase within normal limits at 26.  Lesion of bladder  No evidence of malignancy or dysplasia. Status post bladder biopsy 01/24/13. Pathology report showed reactive inflammation.  Pelvic pain  Chronic. She has had an extensive evaluation by urology. No significant urologic abnormalities have been found. Seen by urologist 02/12/13 with recommendations to consider BNO suppositories for bladder spasms and pelvic pain, Pyridium for dysuria as needed, and NSAIDs. CT cystography negative.  Acute kidney injury secondary to dehydration  Creatinine is 1.06. Baseline creatinine is 0.8. Creatinine is back to usual baseline values status post IV fluids.  DVT prophylaxis  Treated with Lovenox.   Medical Consultants:  Dr. Louis Meckel and Dr. Rolan Bucco, Urology   Discharge Exam: Filed Vitals:   02/14/13 1406  BP: 97/51  Pulse: 64  Temp: 97.8 F (36.6 C)  Resp: 16   Filed Vitals:   02/14/13 0307 02/14/13 0603 02/14/13 1106 02/14/13 1406  BP: 105/49 116/48 114/60 97/51  Pulse:  56 63 64  Temp:  98.2 F (36.8 C) 97.8 F (36.6 C) 97.8 F (36.6 C)  TempSrc:  Oral Oral Oral  Resp:  17 16 16   Height:      Weight:      SpO2:  100% 98% 99%    Gen:  NAD Cardiovascular:  RRR, No M/R/G Respiratory: Lungs CTAB Gastrointestinal: Abdomen soft, NT/ND with normal active bowel sounds. Extremities: No C/E/C   Discharge Instructions      Discharge Orders   Future Orders Complete By Expires   Call MD for:  persistant nausea and vomiting  As directed    Call MD for:  severe uncontrolled pain  As directed  Call MD for:  temperature >100.4  As directed    Diet general  As directed    Discharge instructions  As directed    Comments:     You were cared for by Dr. Jacquelynn Cree  (a hospitalist) during your hospital stay. If you have any questions about your discharge medications or the care you received while you were in the hospital after you are discharged, you can call the unit and ask  to speak with the hospitalist on call if the hospitalist that took care of you is not available. Once you are discharged, your primary care physician will handle any further medical issues. Please note that NO REFILLS for any discharge medications will be authorized once you are discharged, as it is imperative that you return to your primary care physician (or establish a relationship with a primary care physician if you do not have one) for your aftercare needs so that they can reassess your need for medications and monitor your lab values.  Any outstanding tests can be reviewed by your PCP at your follow up visit.  It is also important to review any medicine changes with your PCP.  Please bring these d/c instructions with you to your next visit so your physician can review these changes with you.  If you do not have a primary care physician, you can call [phone] for a physician referral.  It is highly recommended that you obtain a PCP for hospital follow up.   Increase activity slowly  As directed    Scheduling Instructions:     You were hospitalized 02/11/13-02/14/13.  Return to work if cleared by Dr. Jasmine December after your appointment on 02/16/13.       Medication List         HYDROcodone-acetaminophen 5-325 MG per tablet  Commonly known as:  NORCO/VICODIN  Take 1-2 tablets by mouth every 4 (four) hours as needed for moderate pain.     hyoscyamine 0.125 MG tablet  Commonly known as:  LEVSIN, ANASPAZ  Take 1 tablet (0.125 mg total) by mouth every 4 (four) hours as needed (bladder spasms).     opium-belladonna 16.2-60 MG suppository  Commonly known as:  B&O SUPPRETTES  Place 1 suppository rectally every 6 (six) hours as needed for bladder spasms.     oxybutynin 5 MG 24 hr tablet  Commonly known as:  DITROPAN-XL  Take 1 tablet by mouth every morning.     pantoprazole 40 MG tablet  Commonly known as:  PROTONIX  Take 1 tablet (40 mg total) by mouth daily.     phenazopyridine 200 MG tablet    Commonly known as:  PYRIDIUM  Take 1 tablet (200 mg total) by mouth 3 (three) times daily as needed for pain.     senna-docusate 8.6-50 MG per tablet  Commonly known as:  SENOKOT S  Take 1 tablet by mouth 2 (two) times daily.       Follow-up Information   Schedule an appointment as soon as possible for a visit with Redge Gainer, MD. (As needed)    Specialty:  Family Medicine   Contact information:   [address] [place] [address] [phone]       Follow up with Molli Hazard, MD. (At your appointment on 02/16/13.)    Specialty:  Urology   Contact information:   [facility]  [place] [id number] [phone]        The results of significant  diagnostics from this hospitalization (including imaging, microbiology, ancillary and laboratory) are listed below for reference.    Significant Diagnostic Studies: Ct Pelvis W Contrast  02/14/2013   CLINICAL DATA:  Microscopic hematuria, bladder mass status post a status fluid bladder biopsy on 01/24/2013, with pathology showing reactive urothelium. Abdominal pain, fever/chills, evaluate for urine leak or pelvic abscess.  EXAM: CT PELVIS WITH CONTRAST  TECHNIQUE: Multidetector CT imaging of the pelvis was performed using the standard protocol following the bolus administration of intravenous contrast.  CONTRAST:  150mL OMNIPAQUE IOHEXOL 300 MG/ML  SOLN  COMPARISON:  CT abdomen pelvis dated 12/14/2012  FINDINGS: Contrast has been administered into the bladder via indwelling Foley catheter. Associated nondependent gas.  No extraluminal contrast is visualized to suggest leak. No free air.  Small volume pelvic ascites, simple. No findings to suggest pelvic abscess.  No pelvic lymphadenopathy.  Uterus and bilateral ovaries are unremarkable.  Mild degenerative changes of the lower lumbar spine.  IMPRESSION: No evidence of bladder leak.  No free air.  Small volume pelvic ascites, simple.  No  evidence of pelvic abscess.   Electronically Signed   By: Julian Hy M.D.   On: 02/14/2013 14:11    Labs:  Basic Metabolic Panel:  Recent Labs Lab 02/11/13 2231 02/12/13 0707 02/13/13 0413  NA 138 141 138  K 3.6* 4.5 3.9  CL 101 111 107  CO2 21 21 21   GLUCOSE 160* 108* 100*  BUN 22 13 7   CREATININE 1.06 0.86 0.83  CALCIUM 9.1 7.7* 8.1*   GFR Estimated Creatinine Clearance: 64.9 ml/min (by C-G formula based on Cr of 0.83).  Recent Labs Lab 02/13/13 0413  LIPASE 26   CBC:  Recent Labs Lab 02/11/13 2231 02/12/13 0707 02/13/13 0413  WBC 16.8* 14.3* 6.9  NEUTROABS 15.3*  --   --   HGB 12.0 10.0* 10.0*  HCT 35.3* 29.5* 30.3*  MCV 94.1 95.2 95.9  PLT 215 167 174   Microbiology Recent Results (from the past 240 hour(s))  CULTURE, BLOOD (ROUTINE X 2)     Status: None   Collection Time    02/11/13 11:11 PM      Result Value Range Status   Specimen Description BLOOD LEFT ARM   Final   Special Requests BOTTLES DRAWN AEROBIC AND ANAEROBIC 5CC   Final   Culture  Setup Time     Final   Value: 02/12/2013 04:46     Performed at [facility]   Culture     Final   Value:        BLOOD CULTURE RECEIVED NO GROWTH TO DATE CULTURE WILL BE HELD FOR 5 DAYS BEFORE ISSUING A FINAL NEGATIVE REPORT     Performed at [facility]   Report Status PENDING   Incomplete  CULTURE, BLOOD (ROUTINE X 2)     Status: None   Collection Time    02/11/13 11:30 PM      Result Value Range Status   Specimen Description BLOOD RIGHT ARM   Final   Special Requests BOTTLES DRAWN AEROBIC AND ANAEROBIC 5CC   Final   Culture  Setup Time     Final   Value: 02/12/2013 04:47     Performed at [facility]   Culture     Final   Value:        BLOOD CULTURE RECEIVED NO GROWTH TO DATE CULTURE WILL BE HELD FOR 5 DAYS BEFORE ISSUING A FINAL NEGATIVE REPORT  Performed at [facility]   Report Status PENDING   Incomplete  URINE CULTURE     Status: None   Collection Time     02/12/13  2:06 AM      Result Value Range Status   Specimen Description URINE, CATHETERIZED   Final   Special Requests NONE   Final   Culture  Setup Time     Final   Value: 02/12/2013 15:12     Performed at [place]     Final   Value: 5,000 COLONIES/ML     Performed at [facility]   Culture     Final   Value: Multiple bacterial morphotypes present, none predominant. Suggest appropriate recollection if clinically indicated.     Performed at [facility]   Report Status 02/13/2013 FINAL   Final  MRSA PCR SCREENING     Status: None   Collection Time    02/12/13  4:39 AM      Result Value Range Status   MRSA by PCR NEGATIVE  NEGATIVE Final   Comment:            The GeneXpert MRSA Assay (FDA     approved for NASAL specimens     only), is one component of a     comprehensive MRSA colonization     surveillance program. It is not     intended to diagnose MRSA     infection nor to guide or     monitor treatment for     MRSA infections.    Time coordinating discharge: 35 minutes.  Signed:  Claudis Giovanelli  Pager [phone] [facility] 02/14/2013, 3:23 PM

## 2013-02-14 NOTE — Progress Notes (Signed)
Order to remove foley was placed. Foley catheter was removed, pt. Tolerated procedure well.

## 2013-02-14 NOTE — Discharge Instructions (Signed)
Pelvic Pain, Female °Female pelvic pain can be caused by many different things and start from a variety of places. Pelvic pain refers to pain that is located in the lower half of the abdomen and between your hips. The pain may occur over a short period of time (acute) or may be reoccurring (chronic). The cause of pelvic pain may be related to disorders affecting the female reproductive organs (gynecologic), but it may also be related to the bladder, kidney stones, an intestinal complication, or muscle or skeletal problems. Getting help right away for pelvic pain is important, especially if there has been severe, sharp, or a sudden onset of unusual pain. It is also important to get help right away because some types of pelvic pain can be life threatening.  °CAUSES  °Below are only some of the causes of pelvic pain. The causes of pelvic pain can be in one of several categories.  °· Gynecologic. °· Pelvic inflammatory disease. °· Sexually transmitted infection. °· Ovarian cyst or a twisted ovarian ligament (ovarian torsion). °· Uterine lining that grows outside the uterus (endometriosis). °· Fibroids, cysts, or tumors. °· Ovulation. °· Pregnancy. °· Pregnancy that occurs outside the uterus (ectopic pregnancy). °· Miscarriage. °· Labor. °· Abruption of the placenta or ruptured uterus. °· Infection. °· Uterine infection (endometritis). °· Bladder infection. °· Diverticulitis. °· Miscarriage related to a uterine infection (septic abortion). °· Bladder. °· Inflammation of the bladder (cystitis). °· Kidney stone(s). °· Gastrointenstinal. °· Constipation. °· Diverticulitis. °· Neurologic. °· Trauma. °· Feeling pelvic pain because of mental or emotional causes (psychosomatic). °· Cancers of the bowel or pelvis. °EVALUATION  °Your caregiver will want to take a careful history of your concerns. This includes recent changes in your health, a careful gynecologic history of your periods (menses), and a sexual history. Obtaining  your family history and medical history is also important. Your caregiver may suggest a pelvic exam. A pelvic exam will help identify the location and severity of the pain. It also helps in the evaluation of which organ system may be involved. In order to identify the cause of the pelvic pain and be properly treated, your caregiver may order tests. These tests may include:  °· A pregnancy test. °· Pelvic ultrasonography. °· An X-ray exam of the abdomen. °· A urinalysis or evaluation of vaginal discharge. °· Blood tests. °HOME CARE INSTRUCTIONS  °· Only take over-the-counter or prescription medicines for pain, discomfort, or fever as directed by your caregiver.   °· Rest as directed by your caregiver.   °· Eat a balanced diet.   °· Drink enough fluids to make your urine clear or pale yellow, or as directed.   °· Avoid sexual intercourse if it causes pain.   °· Apply warm or cold compresses to the lower abdomen depending on which one helps the pain.   °· Avoid stressful situations.   °· Keep a journal of your pelvic pain. Write down when it started, where the pain is located, and if there are things that seem to be associated with the pain, such as food or your menstrual cycle. °· Follow up with your caregiver as directed.   °SEEK MEDICAL CARE IF: °· Your medicine does not help your pain. °· You have abnormal vaginal discharge. °SEEK IMMEDIATE MEDICAL CARE IF:  °· You have heavy bleeding from the vagina.   °· Your pelvic pain increases.   °· You feel lightheaded or faint.   °· You have chills.   °· You have pain with urination or blood in your urine.   °· You have uncontrolled   diarrhea or vomiting.   °· You have a fever or persistent symptoms for more than 3 days. °· You have a fever and your symptoms suddenly get worse.   °· You are being physically or sexually abused.   °MAKE SURE YOU: °· Understand these instructions. °· Will watch your condition. °· Will get help if you are not doing well or get worse. °Document  Released: 11/27/2003 Document Revised: 07/01/2011 Document Reviewed: 04/21/2011 °[employer]® Patient Information ©2014 [employer]. ° °

## 2013-02-14 NOTE — Care Management Note (Signed)
   CARE MANAGEMENT NOTE 02/14/2013  Patient:  [name], [name]   Account Number:  [id number]  Date Initiated:  02/14/2013  Documentation initiated by:  Luva Metzger  Subjective/Objective Assessment:   63 yo female admitted with abdominal pain.PCP: Redge Gainer, MD     Action/Plan:   Home when stable   Anticipated DC Date:     Anticipated DC Plan:        DC Planning Services  CM consult      Choice offered to / List presented to:  NA   DME arranged  NA      DME agency  NA     [place] arranged  NA      [place] agency  NA   Status of service:  In process, will continue to follow Medicare Important Message given?   (If response is "NO", the following Medicare IM given date fields will be blank) Date Medicare IM given:   Date Additional Medicare IM given:    Discharge Disposition:    Per UR Regulation:  Reviewed for med. necessity/level of care/duration of stay  If discussed at [place] of Stay Meetings, dates discussed:    Comments:  02/14/13 1453 Kadeidra Coryell,MSN,RN [phone] Chart reviewed for utilization of services. Identified PCP and pharmacy. No barriers identified at this time.

## 2013-02-14 NOTE — Progress Notes (Signed)
Pt. Was discharged home. She was given her discharge instructions, prescriptions, and all questions were answered.  She was transported home by family.

## 2013-02-14 NOTE — Progress Notes (Addendum)
[facility] PROGRESS NOTE   [name] LGX:[id number] DOB: 02/04/1950 DOA: 02/11/2013 PCP: Redge Gainer, MD  Brief narrative: IDY RAWLING is an 63 y.o. female with a PMH of microscopic hematuria and bladder mass status post cystoscopy with bladder biopsy on 01/24/13 (treated with a one-week course of Keflex post procedure), pathology showing reactive urothelium, negative for malignancy or dysplasia, who was admitted on 02/12/13 the chief complaint of abdominal pain, fever and chills. Upon initial evaluation in the ED, the patient had hypotension with systolic blood pressures in the 80s and a urinalysis suggestive of UTI.  Assessment/Plan: Principal Problem:   Hypotension in the setting of recent bladder instrumentation and pelvic pain Patient was admitted to the SDU, placed on IV fluids, and started on empiric IV Rocephin due to concerns for sepsis. Evaluated by urologist on 02/12/13.  Transferred to floor 02/12/13. Blood pressure remains stable. WBC normalized 02/13/13. D/C Rocephin as urine cultures were negative. Plan is for urologist to obtain CT cystography for further evaluation of pelvic pain. Active Problems:   Abdominal pain / GERD Continue PPI and GI cocktail PRN.  Lipase within normal limits at 26.   Lesion of bladder No evidence of malignancy or dysplasia. Status post bladder biopsy 01/24/13. Pathology report showed reactive inflammation.   Pelvic pain Chronic. She has had an extensive evaluation by urology. No significant urologic abnormalities have been found. Seen by urologist 02/12/13 with recommendations to consider BNO suppositories for bladder spasms and pelvic pain, Pyridium for dysuria as needed, and NSAIDs. CT cystography ordered by urologist for further evaluation.   Acute kidney injury secondary to dehydration Creatinine is 1.06. Baseline creatinine is 0.8. Creatinine is back to usual baseline values with IV fluids.   DVT prophylaxis Continue Lovenox.    Code  Status: Full. Family Communication: Daughter updated at the bedside. Disposition Plan: Home when stable.   IV access:  Peripheral IV  Medical Consultants:  Dr. Louis Meckel, Urology  Other Consultants:  None  Anti-infectives:  Rocephin 02/12/13--->  HPI/Subjective: [name] reports improvement in reflux symptoms and pelvic pain.  No nausea or vomiting. No further fever/chills.  Objective: Filed Vitals:   02/13/13 2128 02/14/13 0202 02/14/13 0307 02/14/13 0603  BP: 122/55 99/56 105/49 116/48  Pulse: 56 69  56  Temp: 98 F (36.7 C) 98.3 F (36.8 C)  98.2 F (36.8 C)  TempSrc: Oral Oral  Oral  Resp: 18 14  17   Height:      Weight:      SpO2: 99% 98%  100%    Intake/Output Summary (Last 24 hours) at 02/14/13 0933 Last data filed at 02/14/13 0813  Gross per 24 hour  Intake 2185.83 ml  Output   2800 ml  Net -614.17 ml    Exam: Gen:  NAD Cardiovascular:  RRR, No M/R/G Respiratory:  Lungs CTAB Gastrointestinal:  Abdomen soft, NT/ND, + BS Extremities:  No C/E/C  Data Reviewed: Basic Metabolic Panel:  Recent Labs Lab 02/11/13 2231 02/12/13 0707 02/13/13 0413  NA 138 141 138  K 3.6* 4.5 3.9  CL 101 111 107  CO2 21 21 21   GLUCOSE 160* 108* 100*  BUN 22 13 7   CREATININE 1.06 0.86 0.83  CALCIUM 9.1 7.7* 8.1*   GFR Estimated Creatinine Clearance: 64.9 ml/min (by C-G formula based on Cr of 0.83).  CBC:  Recent Labs Lab 02/11/13 2231 02/12/13 0707 02/13/13 0413  WBC 16.8* 14.3* 6.9  NEUTROABS 15.3*  --   --   HGB  12.0 10.0* 10.0*  HCT 35.3* 29.5* 30.3*  MCV 94.1 95.2 95.9  PLT 215 167 174   Microbiology Recent Results (from the past 240 hour(s))  CULTURE, BLOOD (ROUTINE X 2)     Status: None   Collection Time    02/11/13 11:11 PM      Result Value Range Status   Specimen Description BLOOD LEFT ARM   Final   Special Requests BOTTLES DRAWN AEROBIC AND ANAEROBIC 5CC   Final   Culture  Setup Time     Final   Value: 02/12/2013 04:46       Performed at [facility]   Culture     Final   Value:        BLOOD CULTURE RECEIVED NO GROWTH TO DATE CULTURE WILL BE HELD FOR 5 DAYS BEFORE ISSUING A FINAL NEGATIVE REPORT     Performed at [facility]   Report Status PENDING   Incomplete  CULTURE, BLOOD (ROUTINE X 2)     Status: None   Collection Time    02/11/13 11:30 PM      Result Value Range Status   Specimen Description BLOOD RIGHT ARM   Final   Special Requests BOTTLES DRAWN AEROBIC AND ANAEROBIC 5CC   Final   Culture  Setup Time     Final   Value: 02/12/2013 04:47     Performed at [facility]   Culture     Final   Value:        BLOOD CULTURE RECEIVED NO GROWTH TO DATE CULTURE WILL BE HELD FOR 5 DAYS BEFORE ISSUING A FINAL NEGATIVE REPORT     Performed at [facility]   Report Status PENDING   Incomplete  URINE CULTURE     Status: None   Collection Time    02/12/13  2:06 AM      Result Value Range Status   Specimen Description URINE, CATHETERIZED   Final   Special Requests NONE   Final   Culture  Setup Time     Final   Value: 02/12/2013 15:12     Performed at [facility] Count     Final   Value: 5,000 COLONIES/ML     Performed at [facility]   Culture     Final   Value: Multiple bacterial morphotypes present, none predominant. Suggest appropriate recollection if clinically indicated.     Performed at [facility]   Report Status 02/13/2013 FINAL   Final  MRSA PCR SCREENING     Status: None   Collection Time    02/12/13  4:39 AM      Result Value Range Status   MRSA by PCR NEGATIVE  NEGATIVE Final   Comment:            The GeneXpert MRSA Assay (FDA     approved for NASAL specimens     only), is one component of a     comprehensive MRSA colonization     surveillance program. It is not     intended to diagnose MRSA     infection nor to guide or     monitor treatment for     MRSA infections.     Procedures and Diagnostic Studies: No  results found.  Scheduled Meds: . cefTRIAXone (ROCEPHIN)  IV  1 g Intravenous Q24H  . enoxaparin (LOVENOX) injection  40 mg Subcutaneous Daily  . oxybutynin  5 mg Oral q morning -  10a  . pantoprazole  40 mg Oral Daily  . phenazopyridine  200 mg Oral TID WC  . senna-docusate  1 tablet Oral BID   Continuous Infusions: . sodium chloride 75 mL/hr at 02/13/13 2118    Time spent: 25 minutes.    LOS: 3 days   [facility] Pager [phone].   *Please note that the hospitalists switch teams on Wednesdays. Please call the flow manager at [phone] if you are having difficulty reaching the hospitalist taking care of this patient as she can update you and provide the most up-to-date pager number of provider caring for the patient. If 8PM-8AM, please contact night-coverage at [email or web address], password [facility]  02/14/2013, 9:33 AM   Information printed out, explained, and given to the patient:      In an effort to keep you and your family informed about your hospital stay, I am providing you with this information sheet. If you or your family have any questions, please do not hesitate to have the nursing staff page me to set up a meeting time.  [name] 02/14/2013 3 (Number of days in the hospital)  Treatment team:  Dr. Jacquelynn Cree, Hospitalist (Internist)  Dr. Louis Meckel and Dr. Jasmine December (urologist)  Active Treatment Issues with Plan: Principal Problem:   Low blood pressure Evaluated by urologist on 02/12/13.  Blood pressure remains stable after receiving IV fluids. WBC (cells that fight infection) normalized 02/13/13. Urine cultures were negative, so antibiotics are being stopped.  Active Problems:   Lesion of bladder No evidence of malignancy or dysplasia on bladder biopsy done 01/24/13. Pathology report showed reactive inflammation.   Pelvic pain No significant urologic abnormalities have been found. Seen by urologist 02/12/13 with recommendations to  consider B&O suppositories for bladder spasms and pelvic pain (ordered, ask for these if you feel you need them), Pyridium for painful urination as needed, and anti-inflammatory medicine (however you have an aspirin allergy). Dr. Jasmine December has ordered a CT cystogram to further evaluate your pain.  This is a radiographic test where  radiocontrast is instilled in the bladder using a urinary catheter, and X-ray imaging is performed to check for bladder abnormalities.   Dehydration Resolved with IV fluids.   Blood clot prevention On Lovenox.    Anticipated discharge date: 1-2 days depending on what your CT cystogram shows.

## 2013-02-14 NOTE — Progress Notes (Signed)
Urology Progress Note  Subjective:     No acute urologic events overnight. Has some bilateral inguinal discomfort and urgency. Negative gross hematuria or fever.  Discussed w/ patient that her presentation is unusual. I would like to rule out a urine leak or abscess.  ROS: Negative: SOB or chest pain.  Objective:  Patient Vitals for the past 24 hrs:  BP Temp Temp src Pulse Resp SpO2  02/14/13 0603 116/48 mmHg 98.2 F (36.8 C) Oral 56 17 100 %  02/14/13 0307 105/49 mmHg - - - - -  02/14/13 0202 99/56 mmHg 98.3 F (36.8 C) Oral 69 14 98 %  02/13/13 2128 122/55 mmHg 98 F (36.7 C) Oral 56 18 99 %  02/13/13 1806 102/51 mmHg 97.8 F (36.6 C) Oral 71 16 100 %  02/13/13 1328 108/44 mmHg 97.6 F (36.4 C) Oral 71 16 100 %  02/13/13 1000 110/51 mmHg 97.7 F (36.5 C) Oral 65 16 97 %    Physical Exam: General:  No acute distress, awake Cardiovascular:    [x]   S1/S2 present, RRR  []   Irregularly irregular Chest:  CTA-B Abdomen:               []  Soft, appropriately TTP  [x]  Soft, NTTP, ND.  []  Soft, appropriately TTP, incision(s) clean/dry/intact  Genitourinary: No foley catheter.     I/O last 3 completed shifts: In: 4330 [P.O.:1080; I.V.:3250] Out: 4200 [Urine:4200]  Recent Labs     02/12/13  0707  02/13/13  0413  HGB  10.0*  10.0*  WBC  14.3*  6.9  PLT  167  174    Recent Labs     02/12/13  0707  02/13/13  0413  NA  141  138  K  4.5  3.9  CL  111  107  CO2  21  21  BUN  13  7  CREATININE  0.86  0.83  CALCIUM  7.7*  8.1*  GFRNONAA  72*  75*  GFRAA  83*  87*     No results found for this basename: PT, INR, APTT,  in the last 72 hours   No components found with this basename: ABG,     Length of stay: 3 days.  Assessment: Urinary urgency following uncomplicated biopsy.     Plan: CT cystogram today; nurse to place 18 Fr foley. Will follow.   Rolan Bucco, MD [phone]

## 2013-02-18 LAB — CULTURE, BLOOD (ROUTINE X 2)
Culture: NO GROWTH
Culture: NO GROWTH

## 2013-04-27 ENCOUNTER — Ambulatory Visit (INDEPENDENT_AMBULATORY_CARE_PROVIDER_SITE_OTHER): Payer: BC Managed Care – PPO | Admitting: General Practice

## 2013-04-27 ENCOUNTER — Encounter: Payer: Self-pay | Admitting: General Practice

## 2013-04-27 ENCOUNTER — Ambulatory Visit (INDEPENDENT_AMBULATORY_CARE_PROVIDER_SITE_OTHER): Payer: BC Managed Care – PPO

## 2013-04-27 VITALS — BP 122/68 | HR 62 | Temp 98.3°F | Ht 65.0 in | Wt 127.0 lb

## 2013-04-27 DIAGNOSIS — M25559 Pain in unspecified hip: Secondary | ICD-10-CM

## 2013-04-27 DIAGNOSIS — M5431 Sciatica, right side: Secondary | ICD-10-CM

## 2013-04-27 DIAGNOSIS — M25551 Pain in right hip: Secondary | ICD-10-CM

## 2013-04-27 DIAGNOSIS — M5430 Sciatica, unspecified side: Secondary | ICD-10-CM

## 2013-04-27 MED ORDER — METHYLPREDNISOLONE ACETATE 80 MG/ML IJ SUSP
80.0000 mg | Freq: Once | INTRAMUSCULAR | Status: AC
  Administered 2013-04-27: 80 mg via INTRAMUSCULAR

## 2013-04-27 MED ORDER — PREDNISONE (PAK) 10 MG PO TABS: ORAL_TABLET | ORAL | Status: DC

## 2013-04-27 NOTE — Patient Instructions (Signed)
Hip Pain The hips join the upper legs to the lower pelvis. The bones, cartilage, tendons, and muscles of the hip joint perform a lot of work each day holding your body weight and allowing you to move around. Hip pain is a common symptom. It can range from a minor ache to severe pain on 1 or both hips. Pain may be felt on the inside of the hip joint near the groin, or the outside near the buttocks and upper thigh. There may be swelling or stiffness as well. It occurs more often when a person walks or performs activity. There are many reasons hip pain can develop. CAUSES  It is important to work with your caregiver to identify the cause since many conditions can impact the bones, cartilage, muscles, and tendons of the hips. Causes for hip pain include:  Broken (fractured) bones.  Separation of the thighbone from the hip socket (dislocation).  Torn cartilage of the hip joint.  Swelling (inflammation) of a tendon (tendonitis), the sac within the hip joint (bursitis), or a joint.  A weakening in the abdominal wall (hernia), affecting the nerves to the hip.  Arthritis in the hip joint or lining of the hip joint.  Pinched nerves in the back, hip, or upper thigh.  A bulging disc in the spine (herniated disc).  Rarely, bone infection or cancer. DIAGNOSIS  The location of your hip pain will help your caregiver understand what may be causing the pain. A diagnosis is based on your medical history, your symptoms, results from your physical exam, and results from diagnostic tests. Diagnostic tests may include X-ray exams, a computerized magnetic scan (magnetic resonance imaging, MRI), or bone scan. TREATMENT  Treatment will depend on the cause of your hip pain. Treatment may include:  Limiting activities and resting until symptoms improve.  Crutches or other walking supports (a cane or brace).  Ice, elevation, and compression.  Physical therapy or home exercises.  Shoe inserts or special  shoes.  Losing weight.  Medications to reduce pain.  Undergoing surgery. HOME CARE INSTRUCTIONS   Only take over-the-counter or prescription medicines for pain, discomfort, or fever as directed by your caregiver.  Put ice on the injured area:  Put ice in a plastic bag.  Place a towel between your skin and the bag.  Leave the ice on for 15-20 minutes at a time, 03-04 times a day.  Keep your leg raised (elevated) when possible to lessen swelling.  Avoid activities that cause pain.  Follow specific exercises as directed by your caregiver.  Sleep with a pillow between your legs on your most comfortable side.  Record how often you have hip pain, the location of the pain, and what it feels like. This information may be helpful to you and your caregiver.  Ask your caregiver about returning to work or sports and whether you should drive.  Follow up with your caregiver for further exams, therapy, or testing as directed. SEEK MEDICAL CARE IF:   Your pain or swelling continues or worsens after 1 week.  You are feeling unwell or have chills.  You have increasing difficulty with walking.  You have a loss of sensation or other new symptoms.  You have questions or concerns. SEEK IMMEDIATE MEDICAL CARE IF:   You cannot put weight on the affected hip.  You have fallen.  You have a sudden increase in pain and swelling in your hip.  You have a fever. MAKE SURE YOU:   Understand these instructions.  Will watch your condition.  Will get help right away if you are not doing well or get worse. Document Released: 06/19/2009 Document Revised: 03/24/2011 Document Reviewed: 06/19/2009 [facility] Patient Information 2014 [place].   Sciatica Sciatica is pain, weakness, numbness, or tingling along the path of the sciatic nerve. The nerve starts in the lower back and runs down the back of each leg. The nerve controls the muscles in the lower leg and in the back of the knee,  while also providing sensation to the back of the thigh, lower leg, and the sole of your foot. Sciatica is a symptom of another medical condition. For instance, nerve damage or certain conditions, such as a herniated disk or bone spur on the spine, pinch or put pressure on the sciatic nerve. This causes the pain, weakness, or other sensations normally associated with sciatica. Generally, sciatica only affects one side of the body. CAUSES   Herniated or slipped disc.  Degenerative disk disease.  A pain disorder involving the narrow muscle in the buttocks (piriformis syndrome).  Pelvic injury or fracture.  Pregnancy.  Tumor (rare). SYMPTOMS  Symptoms can vary from mild to very severe. The symptoms usually travel from the low back to the buttocks and down the back of the leg. Symptoms can include:  Mild tingling or dull aches in the lower back, leg, or hip.  Numbness in the back of the calf or sole of the foot.  Burning sensations in the lower back, leg, or hip.  Sharp pains in the lower back, leg, or hip.  Leg weakness.  Severe back pain inhibiting movement. These symptoms may get worse with coughing, sneezing, laughing, or prolonged sitting or standing. Also, being overweight may worsen symptoms. DIAGNOSIS  Your caregiver will perform a physical exam to look for common symptoms of sciatica. He or she may ask you to do certain movements or activities that would trigger sciatic nerve pain. Other tests may be performed to find the cause of the sciatica. These may include:  Blood tests.  X-rays.  Imaging tests, such as an MRI or CT scan. TREATMENT  Treatment is directed at the cause of the sciatic pain. Sometimes, treatment is not necessary and the pain and discomfort goes away on its own. If treatment is needed, your caregiver may suggest:  Over-the-counter medicines to relieve pain.  Prescription medicines, such as anti-inflammatory medicine, muscle relaxants, or  narcotics.  Applying heat or ice to the painful area.  Steroid injections to lessen pain, irritation, and inflammation around the nerve.  Reducing activity during periods of pain.  Exercising and stretching to strengthen your abdomen and improve flexibility of your spine. Your caregiver may suggest losing weight if the extra weight makes the back pain worse.  Physical therapy.  Surgery to eliminate what is pressing or pinching the nerve, such as a bone spur or part of a herniated disk. HOME CARE INSTRUCTIONS   Only take over-the-counter or prescription medicines for pain or discomfort as directed by your caregiver.  Apply ice to the affected area for 20 minutes, 3 4 times a day for the first 48 72 hours. Then try heat in the same way.  Exercise, stretch, or perform your usual activities if these do not aggravate your pain.  Attend physical therapy sessions as directed by your caregiver.  Keep all follow-up appointments as directed by your caregiver.  Do not wear high heels or shoes that do not provide proper support.  Check your mattress to see if it  is too soft. A firm mattress may lessen your pain and discomfort. SEEK IMMEDIATE MEDICAL CARE IF:   You lose control of your bowel or bladder (incontinence).  You have increasing weakness in the lower back, pelvis, buttocks, or legs.  You have redness or swelling of your back.  You have a burning sensation when you urinate.  You have pain that gets worse when you lie down or awakens you at night.  Your pain is worse than you have experienced in the past.  Your pain is lasting longer than 4 weeks.  You are suddenly losing weight without reason. MAKE SURE YOU:  Understand these instructions.  Will watch your condition.  Will get help right away if you are not doing well or get worse. Document Released: 12/24/2000 Document Revised: 07/01/2011 Document Reviewed: 05/11/2011 [facility] Patient Information 2014 [place].

## 2013-04-27 NOTE — Progress Notes (Signed)
   Subjective:    Patient ID: [name], female    DOB: 1950/02/04, 63 y.o.   MRN: [id number]  Hip Pain  Incident onset: onset 6 months ago. The incident occurred at home. There was no injury mechanism. The pain is present in the right hip. The quality of the pain is described as burning and aching. The pain is at a severity of 5/10. The pain has been intermittent since onset. Associated symptoms include tingling. Pertinent negatives include no inability to bear weight, loss of motion, loss of sensation, muscle weakness or numbness. She reports no foreign bodies present. The symptoms are aggravated by movement. She has tried acetaminophen for the symptoms.      Review of Systems  Constitutional: Negative for chills and fatigue.  Respiratory: Negative for chest tightness and shortness of breath.   Cardiovascular: Negative for chest pain and palpitations.  Musculoskeletal:       Right hip pain and tingling sensation  Neurological: Positive for tingling. Negative for numbness.       Objective:   Physical Exam  Constitutional: She is oriented to person, place, and time. She appears well-developed and well-nourished.  Cardiovascular: Normal rate, regular rhythm and normal heart sounds.   Pulmonary/Chest: Effort normal and breath sounds normal. No respiratory distress. She exhibits no tenderness.  Musculoskeletal: She exhibits tenderness.  Right iliac crest area tenderness upon palpation. Tingling noted to right lower leg  Neurological: She is alert and oriented to person, place, and time.  Skin: Skin is warm and dry.  Psychiatric: She has a normal mood and affect.          Assessment & Plan:  1. Right hip pain, 2. Sciatica of right side  - DG Hip Complete Right; Future - methylPREDNISolone acetate (DEPO-MEDROL) injection 80 mg; Inject 1 mL (80 mg total) into the muscle once. - predniSONE (STERAPRED UNI-PAK) 10 MG tablet; Start on 04/28/13  Dispense: 21 tablet; Refill: 0 -home  care instructions provided and discussed -RTO if symptoms worsen or unresolved -will refer to specialist -may seek emergency medical treatment Patient verbalized understanding Erby Pian, FNP-C

## 2013-10-17 ENCOUNTER — Ambulatory Visit (INDEPENDENT_AMBULATORY_CARE_PROVIDER_SITE_OTHER): Payer: BC Managed Care – PPO

## 2013-10-17 DIAGNOSIS — Z23 Encounter for immunization: Secondary | ICD-10-CM

## 2014-07-12 ENCOUNTER — Encounter: Payer: Self-pay | Admitting: Nurse Practitioner

## 2014-09-27 ENCOUNTER — Other Ambulatory Visit: Payer: Self-pay | Admitting: Nurse Practitioner

## 2014-09-27 ENCOUNTER — Ambulatory Visit (INDEPENDENT_AMBULATORY_CARE_PROVIDER_SITE_OTHER): Payer: BLUE CROSS/BLUE SHIELD

## 2014-09-27 ENCOUNTER — Encounter: Payer: Self-pay | Admitting: Nurse Practitioner

## 2014-09-27 ENCOUNTER — Ambulatory Visit (INDEPENDENT_AMBULATORY_CARE_PROVIDER_SITE_OTHER): Payer: BLUE CROSS/BLUE SHIELD | Admitting: Nurse Practitioner

## 2014-09-27 VITALS — BP 123/75 | HR 69 | Temp 97.0°F | Ht 65.0 in | Wt 135.0 lb

## 2014-09-27 DIAGNOSIS — Z0000 Encounter for general adult medical examination without abnormal findings: Secondary | ICD-10-CM | POA: Diagnosis not present

## 2014-09-27 DIAGNOSIS — Z01419 Encounter for gynecological examination (general) (routine) without abnormal findings: Secondary | ICD-10-CM | POA: Diagnosis not present

## 2014-09-27 DIAGNOSIS — Z780 Asymptomatic menopausal state: Secondary | ICD-10-CM | POA: Diagnosis not present

## 2014-09-27 DIAGNOSIS — Z13820 Encounter for screening for osteoporosis: Secondary | ICD-10-CM

## 2014-09-27 LAB — POCT URINALYSIS DIPSTICK
BILIRUBIN UA: NEGATIVE
GLUCOSE UA: NEGATIVE
KETONES UA: NEGATIVE
LEUKOCYTES UA: NEGATIVE
Nitrite, UA: NEGATIVE
Protein, UA: NEGATIVE
Urobilinogen, UA: NEGATIVE
pH, UA: 5

## 2014-09-27 LAB — POCT UA - MICROSCOPIC ONLY
CASTS, UR, LPF, POC: NEGATIVE
CRYSTALS, UR, HPF, POC: NEGATIVE
MUCUS UA: NEGATIVE
WBC, Ur, HPF, POC: NEGATIVE
YEAST UA: NEGATIVE

## 2014-09-27 NOTE — Addendum Note (Signed)
Addended by: Chevis Pretty on: 09/27/2014 11:40 AM   Modules accepted: Orders, SmartSet

## 2014-09-27 NOTE — Patient Instructions (Signed)
Exercise to Stay Healthy Exercise helps you become and stay healthy. EXERCISE IDEAS AND TIPS Choose exercises that:  You enjoy.  Fit into your day. You do not need to exercise really hard to be healthy. You can do exercises at a slow or medium level and stay healthy. You can:  Stretch before and after working out.  Try yoga, Pilates, or tai chi.  Lift weights.  Walk fast, swim, jog, run, climb stairs, bicycle, dance, or rollerskate.  Take aerobic classes. Exercises that burn about 150 calories:  Running 1  miles in 15 minutes.  Playing volleyball for 45 to 60 minutes.  Washing and waxing a car for 45 to 60 minutes.  Playing touch football for 45 minutes.  Walking 1  miles in 35 minutes.  Pushing a stroller 1  miles in 30 minutes.  Playing basketball for 30 minutes.  Raking leaves for 30 minutes.  Bicycling 5 miles in 30 minutes.  Walking 2 miles in 30 minutes.  Dancing for 30 minutes.  Shoveling snow for 15 minutes.  Swimming laps for 20 minutes.  Walking up stairs for 15 minutes.  Bicycling 4 miles in 15 minutes.  Gardening for 30 to 45 minutes.  Jumping rope for 15 minutes.  Washing windows or floors for 45 to 60 minutes. Document Released: 02/01/2010 Document Revised: 03/24/2011 Document Reviewed: 02/01/2010 [employer] Patient Information 2015 [employer]. This information is not intended to replace advice given to you by your health care provider. Make sure you discuss any questions you have with your health care provider.  

## 2014-09-27 NOTE — Progress Notes (Addendum)
   Subjective:    Patient ID: [name], female    DOB: Sep 13, 1950, 64 y.o.   MRN: [id number]  HPI Patient is here today annual physical exam and pap- SHe is doing well today without complaints. SHe has GERD but is currently not taking any medication for it currently.    Review of Systems  Constitutional: Negative.   HENT: Negative.   Respiratory: Negative.   Cardiovascular: Negative.   Gastrointestinal: Negative.   Genitourinary: Negative.   Musculoskeletal: Negative.   Neurological: Negative.   Psychiatric/Behavioral: Negative.   All other systems reviewed and are negative.      Objective:   Physical Exam  Constitutional: She is oriented to person, place, and time. She appears well-developed and well-nourished.  HENT:  Head: Normocephalic.  Right Ear: Hearing, tympanic membrane, external ear and ear canal normal.  Left Ear: Hearing, tympanic membrane, external ear and ear canal normal.  Nose: Nose normal.  Mouth/Throat: Uvula is midline and oropharynx is clear and moist.  Eyes: Conjunctivae and EOM are normal. Pupils are equal, round, and reactive to light.  Neck: Normal range of motion and full passive range of motion without pain. Neck supple. No JVD present. Carotid bruit is not present. No thyroid mass and no thyromegaly present.  Cardiovascular: Normal rate, normal heart sounds and intact distal pulses.   No murmur heard. Pulmonary/Chest: Effort normal and breath sounds normal. Right breast exhibits no inverted nipple, no mass, no nipple discharge, no skin change and no tenderness. Left breast exhibits no inverted nipple, no mass, no nipple discharge, no skin change and no tenderness.  Abdominal: Soft. Bowel sounds are normal. She exhibits no mass. There is no tenderness.  Genitourinary: Vagina normal and uterus normal. No breast swelling, tenderness, discharge or bleeding.  bimanual exam-No adnexal masses or tenderness. Cervix parous and pink no discharge    Musculoskeletal: Normal range of motion.  Lymphadenopathy:    She has no cervical adenopathy.  Neurological: She is alert and oriented to person, place, and time.  Skin: Skin is warm and dry.  Psychiatric: She has a normal mood and affect. Her behavior is normal. Judgment and thought content normal.    BP 123/75 mmHg  Pulse 69  Temp(Src) 97 F (36.1 C) (Oral)  Ht _0  (1.651 m)  Wt 135 lb (61.236 kg)  BMI 22.47 kg/m2  Chest xray- no cardiopulmonary disorders noted-Preliminary reading by Ronnald Collum, FNP  [facility]  EKG- sinus rhythmHassell Done, FNP      Assessment & Plan:  1. Annual physical exam - POCT urinalysis dipstick - POCT UA - Microscopic Only - CMP14+EGFR - Lipid panel - CBC with Differential/Platelet - Thyroid Panel With TSH  2. Encounter for routine gynecological examination - Pap IG w/ reflex to HPV when ASC-U   dexa today Labs pending Health maintenance reviewed Diet and exercise encouraged Continue all meds Follow up  In 1 year   [place], FNP

## 2014-09-28 LAB — CBC WITH DIFFERENTIAL/PLATELET
BASOS ABS: 0 10*3/uL (ref 0.0–0.2)
Basos: 1 %
EOS (ABSOLUTE): 0.1 10*3/uL (ref 0.0–0.4)
EOS: 3 %
HEMATOCRIT: 38.1 % (ref 34.0–46.6)
Hemoglobin: 12.5 g/dL (ref 11.1–15.9)
IMMATURE GRANS (ABS): 0 10*3/uL (ref 0.0–0.1)
Immature Granulocytes: 0 %
LYMPHS ABS: 1.4 10*3/uL (ref 0.7–3.1)
Lymphs: 31 %
MCH: 31.2 pg (ref 26.6–33.0)
MCHC: 32.8 g/dL (ref 31.5–35.7)
MCV: 95 fL (ref 79–97)
Monocytes Absolute: 0.5 10*3/uL (ref 0.1–0.9)
Monocytes: 11 %
NEUTROS ABS: 2.5 10*3/uL (ref 1.4–7.0)
Neutrophils: 54 %
Platelets: 261 10*3/uL (ref 150–379)
RBC: 4.01 x10E6/uL (ref 3.77–5.28)
RDW: 12.3 % (ref 12.3–15.4)
WBC: 4.6 10*3/uL (ref 3.4–10.8)

## 2014-09-28 LAB — CMP14+EGFR
A/G RATIO: 1.6 (ref 1.1–2.5)
ALK PHOS: 72 IU/L (ref 39–117)
ALT: 14 IU/L (ref 0–32)
AST: 18 IU/L (ref 0–40)
Albumin: 4.2 g/dL (ref 3.6–4.8)
BILIRUBIN TOTAL: 0.9 mg/dL (ref 0.0–1.2)
BUN/Creatinine Ratio: 18 (ref 11–26)
BUN: 16 mg/dL (ref 8–27)
CHLORIDE: 100 mmol/L (ref 97–108)
CO2: 23 mmol/L (ref 18–29)
Calcium: 9.4 mg/dL (ref 8.7–10.3)
Creatinine, Ser: 0.9 mg/dL (ref 0.57–1.00)
GFR calc Af Amer: 79 mL/min/{1.73_m2} (ref >59)
GFR calc non Af Amer: 69 mL/min/{1.73_m2} (ref >59)
GLOBULIN, TOTAL: 2.6 g/dL (ref 1.5–4.5)
Glucose: 93 mg/dL (ref 65–99)
Potassium: 4.8 mmol/L (ref 3.5–5.2)
SODIUM: 139 mmol/L (ref 134–144)
Total Protein: 6.8 g/dL (ref 6.0–8.5)

## 2014-09-28 LAB — LIPID PANEL
CHOL/HDL RATIO: 2.3 ratio (ref 0.0–4.4)
Cholesterol, Total: 191 mg/dL (ref 100–199)
HDL: 83 mg/dL (ref >39)
LDL Calculated: 95 mg/dL (ref 0–99)
TRIGLYCERIDES: 66 mg/dL (ref 0–149)
VLDL Cholesterol Cal: 13 mg/dL (ref 5–40)

## 2014-09-28 LAB — THYROID PANEL WITH TSH
FREE THYROXINE INDEX: 2.8 (ref 1.2–4.9)
T3 Uptake Ratio: 30 % (ref 24–39)
T4, Total: 9.3 ug/dL (ref 4.5–12.0)
TSH: 0.935 u[IU]/mL (ref 0.450–4.500)

## 2014-09-29 LAB — PAP IG W/ RFLX HPV ASCU: PAP SMEAR COMMENT: 0

## 2014-10-02 ENCOUNTER — Telehealth: Payer: Self-pay | Admitting: Nurse Practitioner

## 2014-10-02 NOTE — Progress Notes (Signed)
Patient aware.

## 2014-10-12 ENCOUNTER — Ambulatory Visit (INDEPENDENT_AMBULATORY_CARE_PROVIDER_SITE_OTHER): Payer: BLUE CROSS/BLUE SHIELD | Admitting: Pharmacist

## 2014-10-12 VITALS — Ht 65.5 in | Wt 135.0 lb

## 2014-10-12 DIAGNOSIS — M8580 Other specified disorders of bone density and structure, unspecified site: Secondary | ICD-10-CM | POA: Insufficient documentation

## 2014-10-12 DIAGNOSIS — Z23 Encounter for immunization: Secondary | ICD-10-CM | POA: Diagnosis not present

## 2014-10-12 MED ORDER — CALCIUM CARBONATE-VITAMIN D 500-200 MG-UNIT PO TABS: 1.0000 | ORAL_TABLET | Freq: Every day | ORAL | Status: DC

## 2014-10-12 NOTE — Patient Instructions (Signed)

## 2014-10-12 NOTE — Progress Notes (Signed)
Patient ID: NDEYE TENORIO, female   DOB: [birth year], 64 y.o.   MRN: [id number]  [facility] Current Height: Height: 5' 5.5" (166.4 cm)      Max Lifetime Height:  5' 5.5" Current Weight: Weight: 135 lb (61.236 kg)       Ethnicity:Caucasian   HPI: Does pt already have a diagnosis of:  Osteopenia?  No Osteoporosis?  No  Back Pain?  No       Kyphosis?  No Prior fracture?  No Med(s) for Osteoporosis/Osteopenia:  none Med(s) previously tried for Osteoporosis/Osteopenia:  none                                                             PMH: Age at menopause:  Early 5's Hysterectomy?  No Oophorectomy?  No HRT? No Steroid Use?  No Thyroid med?  No History of cancer?  No History of digestive disorders (ie Crohn's)?  History of hernia but this has been repaired Current or previous eating disorders?  No Last Vitamin D Result:  Greater than 2 years ago Last GFR Result:  69 (10/02/2014)   FH/SH: Family history of osteoporosis?  No Parent with history of hip fracture?  No Family history of breast cancer?  No Exercise?  Yes - walking about 2-3 days per week about 30 to 35 minutes Smoking?  No Alcohol?  No    Calcium Assessment Calcium Intake  # of servings/day  Calcium mg  Milk (8 oz) 1  x  300  = 300mg   Yogurt (4 oz) 0 x  200 = 0  Cheese (1 oz) 0 x  200 = 0  Other Calcium sources   250mg   Ca supplement 0 = 0   Estimated calcium intake per day 550mg     DEXA Results Date of Test T-Score for AP Spine L1-L4 T-Score for Total Left Hip T-Score for Total Right Hip  09/27/2014 -1.6 -1.1 -1.5  11/17/2007 -0.9 -0.9 -0.8             FRAX 10 year estimate: Total FX risk:  8.2%  (consider medication if >/= 20%) Hip FX risk:  0.9%  (consider medication if >/= 3%)  Assessment: Osteopenia with low FRAX estimate  Recommendations: 1.   Discussed BMD  / DEXA results and discussed fracture risk. 2.  recommend calcium 1200mg  daily through supplementation or diet. Suggested  Calcium + magnesium since patient has had difficulty with constipation and calcium in past.   3.  continue weight bearing exercise - 30 minutes at least 4 days per week.   4.  Counseled and educated about fall risk and prevention. 5.  Vitamin D level ordered. 6.  Influenza vaccines administered today  Recheck DEXA:  2 years - emphasized to patient that important to monitor in 2 years to assess changes.  Time spent counseling patient:  30 minutes   Cherre Robins, PharmD, CPP

## 2014-10-13 LAB — VITAMIN D 25 HYDROXY (VIT D DEFICIENCY, FRACTURES): Vit D, 25-Hydroxy: 35.6 ng/mL (ref 30.0–100.0)

## 2014-10-16 ENCOUNTER — Telehealth: Payer: Self-pay | Admitting: Pharmacist

## 2014-10-16 NOTE — Telephone Encounter (Signed)
Patient notified of vitamin D results.

## 2015-01-10 ENCOUNTER — Ambulatory Visit (INDEPENDENT_AMBULATORY_CARE_PROVIDER_SITE_OTHER): Payer: BLUE CROSS/BLUE SHIELD | Admitting: Physician Assistant

## 2015-01-10 ENCOUNTER — Encounter: Payer: Self-pay | Admitting: Physician Assistant

## 2015-01-10 VITALS — BP 117/73 | HR 72 | Temp 97.8°F | Ht 65.0 in | Wt 138.6 lb

## 2015-01-10 DIAGNOSIS — M545 Low back pain, unspecified: Secondary | ICD-10-CM

## 2015-01-10 MED ORDER — CYCLOBENZAPRINE HCL 10 MG PO TABS: 10.0000 mg | ORAL_TABLET | Freq: Three times a day (TID) | ORAL | Status: DC | PRN

## 2015-01-10 MED ORDER — MELOXICAM 15 MG PO TABS: 15.0000 mg | ORAL_TABLET | Freq: Every day | ORAL | Status: DC

## 2015-01-10 NOTE — Patient Instructions (Signed)

## 2015-01-10 NOTE — Progress Notes (Signed)
Subjective:     Patient ID: [name], female   DOB: 1950/04/09, 64 y.o.   MRN: [id number]  HPI Pt with R side low back pain after getting out of car 1 week ago Denies any radiation of sx to hip or lower ext No change in bowel/bladder function No chronic hx of back pain OTC NSAIDS have helped  sx   Review of Systems  Musculoskeletal: Positive for back pain. Negative for myalgias, arthralgias and gait problem.       Objective:   Physical Exam Gait nl FROM of the L-spine Sx worse with hyperextension No ecchy/edema to the L spine No palp spasm + TTP over the R SI joint SLR neg Muscle strength distal good Sensory good DTR 1+/= lower ext    Assessment:     R low back pain    Plan:     Heat/Ice Mobic 15mg  q d x 10 day Flexeril 10mg  tid prn #21 Pt aware not to take with Elavil F/U prn

## 2015-04-18 DIAGNOSIS — R7989 Other specified abnormal findings of blood chemistry: Secondary | ICD-10-CM | POA: Diagnosis not present

## 2015-06-27 DIAGNOSIS — Z1231 Encounter for screening mammogram for malignant neoplasm of breast: Secondary | ICD-10-CM | POA: Diagnosis not present

## 2015-07-30 DIAGNOSIS — Z139 Encounter for screening, unspecified: Secondary | ICD-10-CM | POA: Diagnosis not present

## 2015-07-30 DIAGNOSIS — R7989 Other specified abnormal findings of blood chemistry: Secondary | ICD-10-CM | POA: Diagnosis not present

## 2015-08-13 DIAGNOSIS — R7989 Other specified abnormal findings of blood chemistry: Secondary | ICD-10-CM | POA: Diagnosis not present

## 2015-09-03 DIAGNOSIS — L814 Other melanin hyperpigmentation: Secondary | ICD-10-CM | POA: Diagnosis not present

## 2015-09-03 DIAGNOSIS — L570 Actinic keratosis: Secondary | ICD-10-CM | POA: Diagnosis not present

## 2015-09-03 DIAGNOSIS — L821 Other seborrheic keratosis: Secondary | ICD-10-CM | POA: Diagnosis not present

## 2015-09-03 DIAGNOSIS — D225 Melanocytic nevi of trunk: Secondary | ICD-10-CM | POA: Diagnosis not present

## 2015-09-12 ENCOUNTER — Ambulatory Visit (INDEPENDENT_AMBULATORY_CARE_PROVIDER_SITE_OTHER): Payer: BLUE CROSS/BLUE SHIELD | Admitting: Family Medicine

## 2015-09-12 VITALS — BP 117/68 | HR 75 | Temp 98.3°F | Ht 65.0 in | Wt 123.2 lb

## 2015-09-12 DIAGNOSIS — R300 Dysuria: Secondary | ICD-10-CM | POA: Diagnosis not present

## 2015-09-12 DIAGNOSIS — R3915 Urgency of urination: Secondary | ICD-10-CM | POA: Diagnosis not present

## 2015-09-12 LAB — URINALYSIS
BILIRUBIN UA: NEGATIVE
Glucose, UA: NEGATIVE
NITRITE UA: NEGATIVE
PH UA: 5 (ref 5.0–7.5)
PROTEIN UA: NEGATIVE
SPEC GRAV UA: 1.01 (ref 1.005–1.030)
UUROB: 0.2 mg/dL (ref 0.2–1.0)

## 2015-09-12 MED ORDER — NITROFURANTOIN MONOHYD MACRO 100 MG PO CAPS: 100.0000 mg | ORAL_CAPSULE | Freq: Two times a day (BID) | ORAL | 0 refills | Status: DC

## 2015-09-12 NOTE — Progress Notes (Signed)
Subjective:  Patient ID: [name], female    DOB: 10/22/1950  Age: 65 y.o. MRN: [id number]  CC: Urinary Tract Infection (dysuria, urinary urgency started this Am)   HPI [name] presents for burning with urination and frequency for one day. Awoke her last night. Denies fever . No flank pain. No nausea, vomiting. Does have HA today    History [name] has a past medical history of GERD (gastroesophageal reflux disease) (02/13/2013); Heart murmur; Hematuria; Lesion of bladder; and Renal insufficiency (02/12/2013).   She has a past surgical history that includes Tubal ligation; Appendectomy (03-09-2008); OPEN HIATAL HERNIA REPAIR (1985); Laparoscopic appendectomy (N/A, 2011); Cholecystectomy (1990); Cholecystectomy; Tubal ligation (Bilateral); Cystoscopy with biopsy (N/A, 01/24/2013); and Cystoscopy w/ retrogrades (Bilateral, 01/24/2013).   Her family history includes COPD in her mother; Cancer in her father.She reports that she has never smoked. She does not have any smokeless tobacco history on file. She reports that she does not drink alcohol or use drugs.    ROS Review of Systems  Constitutional: Negative for chills, diaphoresis and fever.  HENT: Negative for congestion.   Eyes: Negative for visual disturbance.  Respiratory: Negative for cough and shortness of breath.   Cardiovascular: Negative for chest pain and palpitations.  Gastrointestinal: Negative for constipation, diarrhea and nausea.  Genitourinary: Positive for dysuria, frequency and urgency. Negative for decreased urine volume, flank pain, hematuria, menstrual problem and pelvic pain.  Musculoskeletal: Negative for arthralgias and joint swelling.  Skin: Negative for rash.  Neurological: Negative for dizziness and numbness.    Objective:  BP 117/68 (BP Location: Left Arm, Patient Position: Sitting, Cuff Size: Normal)   Pulse 75   Temp 98.3 F (36.8 C) (Oral)   Ht 5\' 5"  (1.651 m)   Wt 123 lb 3.2 oz (55.9 kg)    SpO2 100%   BMI 20.50 kg/m   BP Readings from Last 3 Encounters:  09/12/15 117/68  01/10/15 117/73  09/27/14 123/75    Wt Readings from Last 3 Encounters:  09/12/15 123 lb 3.2 oz (55.9 kg)  01/10/15 138 lb 9.6 oz (62.9 kg)  10/12/14 135 lb (61.2 kg)     Physical Exam  Constitutional: She is oriented to person, place, and time. She appears well-developed and well-nourished.  HENT:  Head: Normocephalic and atraumatic.  Cardiovascular: Normal rate and regular rhythm.   No murmur heard. Pulmonary/Chest: Effort normal and breath sounds normal.  Abdominal: Soft. Bowel sounds are normal. She exhibits no mass. There is no tenderness. There is no rebound and no guarding.  Musculoskeletal: She exhibits no tenderness.  Neurological: She is alert and oriented to person, place, and time.  Skin: Skin is warm and dry.  Psychiatric: She has a normal mood and affect. Her behavior is normal.     Lab Results  Component Value Date   WBC 4.6 09/27/2014   HGB 10.0 (L) 02/13/2013   HCT 38.1 09/27/2014   PLT 261 09/27/2014   GLUCOSE 93 09/27/2014   CHOL 191 09/27/2014   TRIG 66 09/27/2014   HDL 83 09/27/2014   LDLCALC 95 09/27/2014   ALT 14 09/27/2014   AST 18 09/27/2014   NA 139 09/27/2014   K 4.8 09/27/2014   CL 100 09/27/2014   CREATININE 0.90 09/27/2014   BUN 16 09/27/2014   CO2 23 09/27/2014   TSH 0.935 09/27/2014    No results found.  Assessment & Plan:   [name] was seen today for urinary tract infection.  Diagnoses and all  orders for this visit:  Dysuria -     Urinalysis -     Urine culture  Urinary urgency -     Urinalysis -     Urine culture  Other orders -     nitrofurantoin, macrocrystal-monohydrate, (MACROBID) 100 MG capsule; Take 1 capsule (100 mg total) by mouth 2 (two) times daily.      I have discontinued Ms. Lozon's amitriptyline, meloxicam, and cyclobenzaprine. I am also having her start on nitrofurantoin (macrocrystal-monohydrate).  Meds  ordered this encounter  Medications  . nitrofurantoin, macrocrystal-monohydrate, (MACROBID) 100 MG capsule    Sig: Take 1 capsule (100 mg total) by mouth 2 (two) times daily.    Dispense:  14 capsule    Refill:  0     Follow-up: Return if symptoms worsen or fail to improve.  Claretta Fraise, M.D.

## 2015-09-14 LAB — URINE CULTURE

## 2015-09-15 ENCOUNTER — Other Ambulatory Visit: Payer: Self-pay | Admitting: Family Medicine

## 2015-09-15 MED ORDER — SULFAMETHOXAZOLE-TRIMETHOPRIM 800-160 MG PO TABS: 1.0000 | ORAL_TABLET | Freq: Two times a day (BID) | ORAL | 0 refills | Status: DC

## 2015-09-20 ENCOUNTER — Telehealth: Payer: Self-pay | Admitting: Family Medicine

## 2015-09-20 ENCOUNTER — Other Ambulatory Visit: Payer: BLUE CROSS/BLUE SHIELD

## 2015-09-20 DIAGNOSIS — Z87440 Personal history of urinary (tract) infections: Secondary | ICD-10-CM

## 2015-09-20 LAB — MICROSCOPIC EXAMINATION
BACTERIA UA: NONE SEEN
Epithelial Cells (non renal): NONE SEEN /hpf (ref 0–10)

## 2015-09-20 LAB — URINALYSIS, COMPLETE
BILIRUBIN UA: NEGATIVE
GLUCOSE, UA: NEGATIVE
LEUKOCYTES UA: NEGATIVE
Nitrite, UA: NEGATIVE
PROTEIN UA: NEGATIVE
UUROB: 0.2 mg/dL (ref 0.2–1.0)
pH, UA: 5 (ref 5.0–7.5)

## 2015-09-20 NOTE — Telephone Encounter (Signed)
I agree that a new urine specimen would help. Hold off for now. Mark allergy rxn in chart please.

## 2015-09-20 NOTE — Telephone Encounter (Signed)
She started 1 antibiotic then it was switched to something else.   The last antibio she took for 4 days and caused rapid Heart beat, sweats and BS dropped .  She stopped it yesterday  She wants to come in tomorrow for Urine recheck to see if she still needs anything. She doesn't want to try a 3rd antibiotic if she doesn't need to.   Order placed for Ua and culture

## 2015-09-20 NOTE — Telephone Encounter (Signed)
Allergy noted 

## 2015-09-21 LAB — URINE CULTURE

## 2015-10-22 ENCOUNTER — Telehealth: Payer: Self-pay | Admitting: Family Medicine

## 2015-10-22 MED ORDER — AMOXICILLIN-POT CLAVULANATE 875-125 MG PO TABS: 1.0000 | ORAL_TABLET | Freq: Two times a day (BID) | ORAL | 0 refills | Status: DC

## 2015-10-22 NOTE — Telephone Encounter (Signed)
I sent in the requested prescription 

## 2015-10-22 NOTE — Telephone Encounter (Signed)
Please advise if another script can be sent in for patient's unresolved uti.

## 2015-10-22 NOTE — Telephone Encounter (Signed)
Patient's husband notified  

## 2015-10-25 DIAGNOSIS — Z23 Encounter for immunization: Secondary | ICD-10-CM | POA: Diagnosis not present

## 2015-10-29 ENCOUNTER — Ambulatory Visit (INDEPENDENT_AMBULATORY_CARE_PROVIDER_SITE_OTHER): Payer: BLUE CROSS/BLUE SHIELD | Admitting: Nurse Practitioner

## 2015-10-29 ENCOUNTER — Ambulatory Visit (INDEPENDENT_AMBULATORY_CARE_PROVIDER_SITE_OTHER): Payer: BLUE CROSS/BLUE SHIELD

## 2015-10-29 ENCOUNTER — Encounter: Payer: Self-pay | Admitting: Nurse Practitioner

## 2015-10-29 VITALS — BP 107/66 | HR 66 | Temp 97.6°F | Ht 65.0 in | Wt 121.0 lb

## 2015-10-29 DIAGNOSIS — M25561 Pain in right knee: Secondary | ICD-10-CM

## 2015-10-29 MED ORDER — MELOXICAM 15 MG PO TABS: 15.0000 mg | ORAL_TABLET | Freq: Every day | ORAL | 3 refills | Status: DC

## 2015-10-29 NOTE — Patient Instructions (Signed)

## 2015-10-29 NOTE — Progress Notes (Signed)
Subjective:    [name] is a 65 y.o. female who presents with knee pain involving the right knee. Onset was sudden, starting about 6 months ago. Inciting event: this is a longstanding problem which has been getting worse. She denies any trauma or falls Current symptoms include: burning sensation in the knee down to 4th and 5th toes. Pain is aggravated by inactivity, kneeling and squatting. Patient has had no prior knee problems. Evaluation to date: none. Treatment to date: none. Patient is currently taking Amoxicillin for a UTI  The following portions of the patient's history were reviewed and updated as appropriate: allergies, current medications, past family history, past medical history, past social history, past surgical history and problem list.   Review of Systems Pertinent items are noted in HPI. Pertinent items noted in HPI and remainder of comprehensive ROS otherwise negative.   Objective:    BP 107/66   Pulse 66   Temp 97.6 F (36.4 C) (Oral)   Ht 5\' 5"  (1.651 m)   Wt 121 lb (54.9 kg)   BMI 20.14 kg/m  Right knee: normal, no effusion, full active range of motion, no joint line tenderness, ligamentous structures intact. and crepitus noted in the right knee joint  Left knee:  normal and no effusion, full active range of motion, no joint line tenderness, ligamentous structures intact.     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 Reduction in offending activity. NSAIDs per medication orders.    1. Acute pain of right knee RICE as needed Meds ordered this encounter  Medications  . meloxicam (MOBIC) 15 MG tablet    Sig: Take 1 tablet (15 mg total) by mouth daily.    Dispense:  30 tablet    Refill:  3   - DG Knee 1-2 Views Right; Future -  X-ray knees bilateral AP standing; Future  Mary-Margaret Hassell Done, FNP

## 2015-11-20 DIAGNOSIS — R300 Dysuria: Secondary | ICD-10-CM | POA: Diagnosis not present

## 2015-11-20 DIAGNOSIS — R102 Pelvic and perineal pain: Secondary | ICD-10-CM | POA: Diagnosis not present

## 2016-01-08 ENCOUNTER — Encounter: Payer: Self-pay | Admitting: Nurse Practitioner

## 2016-01-08 ENCOUNTER — Encounter (INDEPENDENT_AMBULATORY_CARE_PROVIDER_SITE_OTHER): Payer: Self-pay

## 2016-01-08 ENCOUNTER — Ambulatory Visit (INDEPENDENT_AMBULATORY_CARE_PROVIDER_SITE_OTHER): Payer: BLUE CROSS/BLUE SHIELD | Admitting: Nurse Practitioner

## 2016-01-08 VITALS — BP 139/70 | HR 78 | Temp 97.8°F | Ht 65.0 in | Wt 121.0 lb

## 2016-01-08 DIAGNOSIS — Z0000 Encounter for general adult medical examination without abnormal findings: Secondary | ICD-10-CM | POA: Diagnosis not present

## 2016-01-08 DIAGNOSIS — Z1211 Encounter for screening for malignant neoplasm of colon: Secondary | ICD-10-CM

## 2016-01-08 DIAGNOSIS — Z1159 Encounter for screening for other viral diseases: Secondary | ICD-10-CM

## 2016-01-08 DIAGNOSIS — Z1212 Encounter for screening for malignant neoplasm of rectum: Secondary | ICD-10-CM

## 2016-01-08 DIAGNOSIS — Z01419 Encounter for gynecological examination (general) (routine) without abnormal findings: Secondary | ICD-10-CM

## 2016-01-08 DIAGNOSIS — J0100 Acute maxillary sinusitis, unspecified: Secondary | ICD-10-CM

## 2016-01-08 LAB — URINALYSIS, COMPLETE
Bilirubin, UA: NEGATIVE
Glucose, UA: NEGATIVE
Ketones, UA: NEGATIVE
Leukocytes, UA: NEGATIVE
NITRITE UA: NEGATIVE
PH UA: 7.5 (ref 5.0–7.5)
Protein, UA: NEGATIVE
Specific Gravity, UA: 1.015 (ref 1.005–1.030)
UUROB: 0.2 mg/dL (ref 0.2–1.0)

## 2016-01-08 LAB — MICROSCOPIC EXAMINATION

## 2016-01-08 MED ORDER — AZITHROMYCIN 250 MG PO TABS: ORAL_TABLET | ORAL | 0 refills | Status: DC

## 2016-01-08 NOTE — Patient Instructions (Signed)

## 2016-01-08 NOTE — Progress Notes (Addendum)
Subjective:    Patient ID: [name], female    DOB: May 08, 1950, 65 y.o.   MRN: [id number]  HPI Patient in today for annual physical exam and PAP. SHe has no current medical problems and is on no meds. SHe is doing well without complaints. She is c/o cough and congestion that started several days ago. She says taht her teeth started hurting yesterday and she has a  Slight headache.    Review of Systems  Constitutional: Negative.  Negative for appetite change, chills and fever.  HENT: Positive for congestion, rhinorrhea, sinus pain, sinus pressure, sore throat and voice change. Negative for ear pain and trouble swallowing.   Respiratory: Positive for cough (nonproductive).   Cardiovascular: Negative.   Gastrointestinal: Negative.   Genitourinary: Negative.   Neurological: Positive for headaches.  Psychiatric/Behavioral: Negative.   All other systems reviewed and are negative.      Objective:   Physical Exam  Constitutional: She is oriented to person, place, and time. She appears well-developed and well-nourished.  HENT:  Head: Normocephalic.  Right Ear: Hearing, tympanic membrane, external ear and ear canal normal.  Left Ear: Hearing, tympanic membrane, external ear and ear canal normal.  Nose: Nose normal.  Mouth/Throat: Uvula is midline and oropharynx is clear and moist.  Eyes: Conjunctivae and EOM are normal. Pupils are equal, round, and reactive to light.  Neck: Normal range of motion and full passive range of motion without pain. Neck supple. No JVD present. Carotid bruit is not present. No thyroid mass and no thyromegaly present.  Cardiovascular: Normal rate, normal heart sounds and intact distal pulses.   No murmur heard. Pulmonary/Chest: Effort normal and breath sounds normal. Right breast exhibits no inverted nipple, no mass, no nipple discharge, no skin change and no tenderness. Left breast exhibits no inverted nipple, no mass, no nipple discharge, no skin change and  no tenderness.  Abdominal: Soft. Bowel sounds are normal. She exhibits no mass. There is no tenderness.  Genitourinary: Vagina normal and uterus normal. No breast swelling, tenderness, discharge or bleeding.  Genitourinary Comments: bimanual exam-No adnexal masses or tenderness. Cervix parous and pink no discharge.  Musculoskeletal: Normal range of motion.  Lymphadenopathy:    She has no cervical adenopathy.  Neurological: She is alert and oriented to person, place, and time.  Skin: Skin is warm and dry.  Psychiatric: She has a normal mood and affect. Her behavior is normal. Judgment and thought content normal.   BP 139/70   Pulse 78   Temp 97.8 F (36.6 C) (Oral)   Ht 5' 5"  (1.651 m)   Wt 121 lb (54.9 kg)   BMI 20.14 kg/m      Assessment & Plan:  1. Annual physical exam - Urinalysis, Complete - CMP14+EGFR - Lipid panel - CBC with Differential/Platelet - Thyroid Panel With TSH  2. Encounter for colorectal cancer screening - Fecal occult blood, imunochemical; Future  3. Need for hepatitis C screening test - Hepatitis C antibody  4. Encounter for gynecological examination without abnormal finding - IGP, Aptima HPV, rfx 16/18,45  5. sinuitis - z pak as directed 1. Take meds as prescribed 2. Use a cool mist humidifier especially during the winter months and when heat has been humid. 3. Use saline nose sprays frequently 4. Saline irrigations of the nose can be very helpful if done frequently.  * 4X daily for 1 week*  * Use of a nettie pot can be helpful with this. Follow directions with this* 5.  Drink plenty of fluids 6. Keep thermostat turn down low 7.For any cough or congestion  Use plain Mucinex- regular strength or max strength is fine   * Children- consult with Pharmacist for dosing 8. For fever or aces or pains- take tylenol or ibuprofen appropriate for age and weight.  * for fevers greater than 101 orally you may alternate ibuprofen and tylenol every  3  hours.    Labs pending Health maintenance reviewed Diet and exercise encouraged Continue all meds Follow up  In 1 year   [place], FNP

## 2016-01-08 NOTE — Addendum Note (Signed)
Addended by: Chevis Pretty on: 01/08/2016 10:23 AM   Modules accepted: Orders

## 2016-01-09 LAB — CBC WITH DIFFERENTIAL/PLATELET
BASOS: 0 %
Basophils Absolute: 0 10*3/uL (ref 0.0–0.2)
EOS (ABSOLUTE): 0 10*3/uL (ref 0.0–0.4)
EOS: 0 %
HEMATOCRIT: 41 % (ref 34.0–46.6)
Hemoglobin: 13.6 g/dL (ref 11.1–15.9)
IMMATURE GRANS (ABS): 0 10*3/uL (ref 0.0–0.1)
IMMATURE GRANULOCYTES: 0 %
Lymphocytes Absolute: 1.2 10*3/uL (ref 0.7–3.1)
Lymphs: 20 %
MCH: 30.8 pg (ref 26.6–33.0)
MCHC: 33.2 g/dL (ref 31.5–35.7)
MCV: 93 fL (ref 79–97)
MONOS ABS: 0.2 10*3/uL (ref 0.1–0.9)
Monocytes: 4 %
NEUTROS PCT: 76 %
Neutrophils Absolute: 4.7 10*3/uL (ref 1.4–7.0)
Platelets: 255 10*3/uL (ref 150–379)
RBC: 4.41 x10E6/uL (ref 3.77–5.28)
RDW: 12.5 % (ref 12.3–15.4)
WBC: 6.2 10*3/uL (ref 3.4–10.8)

## 2016-01-09 LAB — CMP14+EGFR
ALBUMIN: 4.7 g/dL (ref 3.6–4.8)
ALK PHOS: 126 IU/L — AB (ref 39–117)
ALT: 19 IU/L (ref 0–32)
AST: 21 IU/L (ref 0–40)
Albumin/Globulin Ratio: 1.6 (ref 1.2–2.2)
BUN / CREAT RATIO: 13 (ref 12–28)
BUN: 12 mg/dL (ref 8–27)
Bilirubin Total: 0.8 mg/dL (ref 0.0–1.2)
CALCIUM: 9.7 mg/dL (ref 8.7–10.3)
CO2: 24 mmol/L (ref 18–29)
CREATININE: 0.95 mg/dL (ref 0.57–1.00)
Chloride: 99 mmol/L (ref 96–106)
GFR calc Af Amer: 74 mL/min/{1.73_m2} (ref >59)
GFR, EST NON AFRICAN AMERICAN: 64 mL/min/{1.73_m2} (ref >59)
GLOBULIN, TOTAL: 3 g/dL (ref 1.5–4.5)
GLUCOSE: 96 mg/dL (ref 65–99)
Potassium: 4.3 mmol/L (ref 3.5–5.2)
SODIUM: 141 mmol/L (ref 134–144)
TOTAL PROTEIN: 7.7 g/dL (ref 6.0–8.5)

## 2016-01-09 LAB — LIPID PANEL
CHOL/HDL RATIO: 2 ratio (ref 0.0–4.4)
CHOLESTEROL TOTAL: 186 mg/dL (ref 100–199)
HDL: 91 mg/dL (ref >39)
LDL CALC: 83 mg/dL (ref 0–99)
Triglycerides: 62 mg/dL (ref 0–149)
VLDL CHOLESTEROL CAL: 12 mg/dL (ref 5–40)

## 2016-01-09 LAB — HEPATITIS C ANTIBODY

## 2016-01-09 LAB — THYROID PANEL WITH TSH
FREE THYROXINE INDEX: 2.3 (ref 1.2–4.9)
T3 UPTAKE RATIO: 29 % (ref 24–39)
T4, Total: 7.9 ug/dL (ref 4.5–12.0)
TSH: 0.812 u[IU]/mL (ref 0.450–4.500)

## 2016-01-11 ENCOUNTER — Telehealth: Payer: Self-pay | Admitting: Nurse Practitioner

## 2016-01-11 LAB — IGP, APTIMA HPV, RFX 16/18,45
HPV Aptima: NEGATIVE
PAP SMEAR COMMENT: 0

## 2016-01-11 NOTE — Telephone Encounter (Signed)
Patient states that she normally does yearly follow up but her lab results states for her to come back in 3 months. Would you like her to come back in 3 months or a year? Please advise

## 2016-01-11 NOTE — Telephone Encounter (Signed)
Patient aware.

## 2016-01-11 NOTE — Telephone Encounter (Signed)
6 months to a year will be fine

## 2016-01-29 ENCOUNTER — Ambulatory Visit (INDEPENDENT_AMBULATORY_CARE_PROVIDER_SITE_OTHER): Payer: BLUE CROSS/BLUE SHIELD

## 2016-01-29 ENCOUNTER — Encounter: Payer: Self-pay | Admitting: Family Medicine

## 2016-01-29 ENCOUNTER — Ambulatory Visit (INDEPENDENT_AMBULATORY_CARE_PROVIDER_SITE_OTHER): Payer: BLUE CROSS/BLUE SHIELD | Admitting: Family Medicine

## 2016-01-29 VITALS — BP 108/67 | HR 86 | Temp 98.0°F | Ht 65.0 in | Wt 120.0 lb

## 2016-01-29 DIAGNOSIS — R1033 Periumbilical pain: Secondary | ICD-10-CM | POA: Diagnosis not present

## 2016-01-29 MED ORDER — HYOSCYAMINE SULFATE 0.125 MG PO TBDP: 0.1250 mg | ORAL_TABLET | ORAL | 2 refills | Status: DC | PRN

## 2016-01-29 NOTE — Patient Instructions (Signed)
Instruction for constipation: Use MiraLAX 1 capful twice daily. If after 2 days you get satisfactory result add magnesium citrate 1 bottle full daily.  Once you have had good results from that combination, you can start taking the medication for cramping. That prescription should be at your drugstore for you.

## 2016-01-29 NOTE — Progress Notes (Signed)
Subjective:  Patient ID: [name], female    DOB: 06-Nov-1950  Age: 66 y.o. MRN: [id number]  CC: Abdominal Pain (pt here today c/o stomach pains )   HPI [name] presents for Periumbilical cramping sensation starting day before yesterday. There is a little bit of nausea but appetite is normal. Had a bowel movement this morning that was a little bit loose but they're not frequent. Yesterday's bowel movement was normal. She has a cramp in the lower aspect of the periumbilical region that lasts a minute or 2 and recurs every 15 minutes or so. Described as moderately severe. Getting worse since onset 1-1/2 days to 2 days ago.   History [name] has a past medical history of GERD (gastroesophageal reflux disease) (02/13/2013); Heart murmur; Hematuria; Lesion of bladder; and Renal insufficiency (02/12/2013).   She has a past surgical history that includes Tubal ligation; Appendectomy (03-09-2008); OPEN HIATAL HERNIA REPAIR (1985); Laparoscopic appendectomy (N/A, 2011); Cholecystectomy (1990); Cholecystectomy; Tubal ligation (Bilateral); Cystoscopy with biopsy (N/A, 01/24/2013); and Cystoscopy w/ retrogrades (Bilateral, 01/24/2013).   Her family history includes COPD in her mother; Cancer in her father.She reports that she has never smoked. She does not have any smokeless tobacco history on file. She reports that she does not drink alcohol or use drugs.    ROS Review of Systems  Constitutional: Negative for activity change, appetite change and fever.  HENT: Negative for congestion, rhinorrhea and sore throat.   Eyes: Negative for visual disturbance.  Respiratory: Negative for cough and shortness of breath.   Cardiovascular: Negative for chest pain and palpitations.  Gastrointestinal: Positive for abdominal pain and nausea. Negative for diarrhea.  Genitourinary: Negative for dysuria.  Musculoskeletal: Negative for arthralgias and myalgias.    Objective:  BP 108/67   Pulse 86   Temp 98 F  (36.7 C) (Oral)   Ht 5\' 5"  (1.651 m)   Wt 120 lb (54.4 kg)   BMI 19.97 kg/m   BP Readings from Last 3 Encounters:  01/29/16 108/67  01/08/16 139/70  10/29/15 107/66    Wt Readings from Last 3 Encounters:  01/29/16 120 lb (54.4 kg)  01/08/16 121 lb (54.9 kg)  10/29/15 121 lb (54.9 kg)     Physical Exam  Constitutional: She is oriented to person, place, and time. She appears well-developed and well-nourished.  HENT:  Head: Normocephalic and atraumatic.  Cardiovascular: Normal rate and regular rhythm.   No murmur heard. Pulmonary/Chest: Effort normal and breath sounds normal.  Abdominal: Soft. Bowel sounds are normal. She exhibits no mass. There is tenderness (inferior to umbilicus). There is no rebound and no guarding.  Neurological: She is alert and oriented to person, place, and time.  Skin: Skin is warm and dry.  Psychiatric: She has a normal mood and affect. Her behavior is normal.    No results found.  Assessment & Plan:   [name] was seen today for abdominal pain.  Diagnoses and all orders for this visit:  Acute periumbilical pain -     Urinalysis -     DG Abd 2 Views; Future  Other orders -     hyoscyamine (NULEV) 0.125 MG TBDP disintergrating tablet; Place 1 tablet (0.125 mg total) under the tongue every 4 (four) hours as needed. For abdominal cramping    Use miralax BID & Mag citrate daily until good result seen.  I have discontinued Ms. Leaton's azithromycin. I am also having her start on hyoscyamine.  Allergies as of 01/29/2016  Reactions   Asa [aspirin] Other (See Comments)   Contraindicated for hernia   Ciprofloxacin Hcl Other (See Comments)   Burning  both legs below knees as if on fire.   Septra Ds [sulfamethoxazole-trimethoprim]    Rapid HR, sweats , and low blood sugar   Tagamet [cimetidine] Rash      Medication List       Accurate as of 01/29/16  5:25 PM. Always use your most recent med list.          hyoscyamine 0.125 MG Tbdp  disintergrating tablet Commonly known as:  NULEV Place 1 tablet (0.125 mg total) under the tongue every 4 (four) hours as needed. For abdominal cramping        Follow-up: Return if symptoms worsen or fail to improve.  [name], M.D.

## 2016-02-01 LAB — URINALYSIS
BILIRUBIN UA: NEGATIVE
GLUCOSE, UA: NEGATIVE
KETONES UA: NEGATIVE
Leukocytes, UA: NEGATIVE
Nitrite, UA: NEGATIVE
PROTEIN UA: NEGATIVE
Specific Gravity, UA: 1.025 (ref 1.005–1.030)
UUROB: 0.2 mg/dL (ref 0.2–1.0)
pH, UA: 5 (ref 5.0–7.5)

## 2016-02-06 DIAGNOSIS — Z008 Encounter for other general examination: Secondary | ICD-10-CM | POA: Diagnosis not present

## 2016-02-06 DIAGNOSIS — R7989 Other specified abnormal findings of blood chemistry: Secondary | ICD-10-CM | POA: Diagnosis not present

## 2016-02-06 DIAGNOSIS — Z1389 Encounter for screening for other disorder: Secondary | ICD-10-CM | POA: Diagnosis not present

## 2016-06-11 DIAGNOSIS — R7989 Other specified abnormal findings of blood chemistry: Secondary | ICD-10-CM | POA: Diagnosis not present

## 2016-06-25 DIAGNOSIS — R7989 Other specified abnormal findings of blood chemistry: Secondary | ICD-10-CM | POA: Diagnosis not present

## 2016-06-26 DIAGNOSIS — Z1231 Encounter for screening mammogram for malignant neoplasm of breast: Secondary | ICD-10-CM | POA: Diagnosis not present

## 2016-07-14 NOTE — Progress Notes (Signed)
lmtcb

## 2016-07-17 ENCOUNTER — Telehealth: Payer: Self-pay | Admitting: Nurse Practitioner

## 2016-07-17 NOTE — Telephone Encounter (Signed)
When patient saw MMM for physical in December she was given a stool card to bring back and she has not done so.  Patient has lost the original card, so she will come by to pick up a new one and get sample back to [place].

## 2016-09-03 DIAGNOSIS — L570 Actinic keratosis: Secondary | ICD-10-CM | POA: Diagnosis not present

## 2017-01-23 ENCOUNTER — Telehealth: Payer: Self-pay | Admitting: Nurse Practitioner

## 2017-01-23 NOTE — Telephone Encounter (Signed)
Please order lab work or advise if patient should wait until appointment.

## 2017-01-23 NOTE — Telephone Encounter (Signed)
The lab can let me know when sheis here and we will put in orders

## 2017-01-26 NOTE — Telephone Encounter (Signed)
Noted  

## 2017-01-27 ENCOUNTER — Encounter: Payer: BLUE CROSS/BLUE SHIELD | Admitting: Nurse Practitioner

## 2017-02-06 ENCOUNTER — Other Ambulatory Visit: Payer: BLUE CROSS/BLUE SHIELD

## 2017-02-06 DIAGNOSIS — Z0000 Encounter for general adult medical examination without abnormal findings: Secondary | ICD-10-CM | POA: Diagnosis not present

## 2017-02-07 LAB — LIPID PANEL
CHOLESTEROL TOTAL: 184 mg/dL (ref 100–199)
Chol/HDL Ratio: 2 ratio (ref 0.0–4.4)
HDL: 90 mg/dL (ref >39)
LDL CALC: 83 mg/dL (ref 0–99)
Triglycerides: 53 mg/dL (ref 0–149)
VLDL Cholesterol Cal: 11 mg/dL (ref 5–40)

## 2017-02-07 LAB — CMP14+EGFR
ALBUMIN: 4.5 g/dL (ref 3.6–4.8)
ALK PHOS: 71 IU/L (ref 39–117)
ALT: 12 IU/L (ref 0–32)
AST: 18 IU/L (ref 0–40)
Albumin/Globulin Ratio: 1.7 (ref 1.2–2.2)
BILIRUBIN TOTAL: 1 mg/dL (ref 0.0–1.2)
BUN / CREAT RATIO: 16 (ref 12–28)
BUN: 15 mg/dL (ref 8–27)
CHLORIDE: 102 mmol/L (ref 96–106)
CO2: 24 mmol/L (ref 20–29)
Calcium: 9.4 mg/dL (ref 8.7–10.3)
Creatinine, Ser: 0.91 mg/dL (ref 0.57–1.00)
GFR calc Af Amer: 77 mL/min/{1.73_m2} (ref >59)
GFR calc non Af Amer: 67 mL/min/{1.73_m2} (ref >59)
GLUCOSE: 100 mg/dL — AB (ref 65–99)
Globulin, Total: 2.7 g/dL (ref 1.5–4.5)
Potassium: 4.4 mmol/L (ref 3.5–5.2)
Sodium: 141 mmol/L (ref 134–144)
TOTAL PROTEIN: 7.2 g/dL (ref 6.0–8.5)

## 2017-02-07 LAB — CBC WITH DIFFERENTIAL/PLATELET
BASOS ABS: 0 10*3/uL (ref 0.0–0.2)
BASOS: 0 %
EOS (ABSOLUTE): 0.1 10*3/uL (ref 0.0–0.4)
Eos: 2 %
Hematocrit: 36.9 % (ref 34.0–46.6)
Hemoglobin: 12.4 g/dL (ref 11.1–15.9)
Immature Grans (Abs): 0 10*3/uL (ref 0.0–0.1)
Immature Granulocytes: 0 %
LYMPHS ABS: 1.7 10*3/uL (ref 0.7–3.1)
Lymphs: 35 %
MCH: 31.9 pg (ref 26.6–33.0)
MCHC: 33.6 g/dL (ref 31.5–35.7)
MCV: 95 fL (ref 79–97)
Monocytes Absolute: 0.3 10*3/uL (ref 0.1–0.9)
Monocytes: 7 %
NEUTROS ABS: 2.7 10*3/uL (ref 1.4–7.0)
Neutrophils: 56 %
PLATELETS: 229 10*3/uL (ref 150–379)
RBC: 3.89 x10E6/uL (ref 3.77–5.28)
RDW: 12.3 % (ref 12.3–15.4)
WBC: 4.8 10*3/uL (ref 3.4–10.8)

## 2017-02-07 LAB — THYROID PANEL WITH TSH
FREE THYROXINE INDEX: 2.2 (ref 1.2–4.9)
T3 UPTAKE RATIO: 29 % (ref 24–39)
T4, Total: 7.7 ug/dL (ref 4.5–12.0)
TSH: 0.843 u[IU]/mL (ref 0.450–4.500)

## 2017-02-12 ENCOUNTER — Encounter: Payer: BLUE CROSS/BLUE SHIELD | Admitting: Nurse Practitioner

## 2017-02-17 ENCOUNTER — Other Ambulatory Visit: Payer: BLUE CROSS/BLUE SHIELD

## 2017-02-17 ENCOUNTER — Encounter: Payer: Self-pay | Admitting: Nurse Practitioner

## 2017-02-17 ENCOUNTER — Ambulatory Visit (INDEPENDENT_AMBULATORY_CARE_PROVIDER_SITE_OTHER): Payer: BLUE CROSS/BLUE SHIELD

## 2017-02-17 ENCOUNTER — Ambulatory Visit (INDEPENDENT_AMBULATORY_CARE_PROVIDER_SITE_OTHER): Payer: BLUE CROSS/BLUE SHIELD | Admitting: Nurse Practitioner

## 2017-02-17 VITALS — BP 104/62 | HR 65 | Temp 97.1°F | Ht 65.0 in | Wt 124.0 lb

## 2017-02-17 DIAGNOSIS — Z01419 Encounter for gynecological examination (general) (routine) without abnormal findings: Secondary | ICD-10-CM | POA: Diagnosis not present

## 2017-02-17 DIAGNOSIS — M8588 Other specified disorders of bone density and structure, other site: Secondary | ICD-10-CM

## 2017-02-17 DIAGNOSIS — Z0000 Encounter for general adult medical examination without abnormal findings: Secondary | ICD-10-CM

## 2017-02-17 DIAGNOSIS — K219 Gastro-esophageal reflux disease without esophagitis: Secondary | ICD-10-CM

## 2017-02-17 LAB — URINALYSIS, COMPLETE
BILIRUBIN UA: NEGATIVE
Glucose, UA: NEGATIVE
Ketones, UA: NEGATIVE
Leukocytes, UA: NEGATIVE
Nitrite, UA: NEGATIVE
PH UA: 6.5 (ref 5.0–7.5)
PROTEIN UA: NEGATIVE
Specific Gravity, UA: 1.015 (ref 1.005–1.030)
Urobilinogen, Ur: 0.2 mg/dL (ref 0.2–1.0)

## 2017-02-17 LAB — MICROSCOPIC EXAMINATION: RENAL EPITHEL UA: NONE SEEN /HPF

## 2017-02-17 NOTE — Progress Notes (Signed)
Subjective:    Patient ID: [name], female    DOB: 1950/02/13, 67 y.o.   MRN: [id number]  HPI  [name] is here today for annual physical and PAP.    1. Annual physical exam  Has not had physical since 2017  2. Gynecologic exam normal   3. Gastroesophageal reflux disease without esophagitis  She only takes OTC meds as needed  4. 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    Review of Systems  Constitutional: Negative for activity change and appetite change.  HENT: Negative.   Eyes: Negative for pain.  Respiratory: Negative for shortness of breath.   Cardiovascular: Negative for chest pain, palpitations and leg swelling.  Gastrointestinal: Negative for abdominal pain.  Endocrine: Negative for polydipsia.  Genitourinary: Negative.   Skin: Negative for rash.  Neurological: Negative for dizziness, weakness and headaches.  Hematological: Does not bruise/bleed easily.  Psychiatric/Behavioral: Negative.   All other systems reviewed and are negative.      Objective:   Physical Exam  Constitutional: She is oriented to person, place, and time. She appears well-developed and well-nourished.  HENT:  Head: Normocephalic.  Right Ear: Hearing, tympanic membrane, external ear and ear canal normal.  Left Ear: Hearing, tympanic membrane, external ear and ear canal normal.  Nose: Nose normal.  Mouth/Throat: Uvula is midline and oropharynx is clear and moist.  Eyes: Conjunctivae and EOM are normal. Pupils are equal, round, and reactive to light.  Neck: Normal range of motion and full passive range of motion without pain. Neck supple. No JVD present. Carotid bruit is not present. No thyroid mass and no thyromegaly present.  Cardiovascular: Normal rate, normal heart sounds and intact distal pulses.  No murmur heard. Pulmonary/Chest: Effort normal and breath  sounds normal. Right breast exhibits no inverted nipple, no mass, no nipple discharge, no skin change and no tenderness. Left breast exhibits no inverted nipple, no mass, no nipple discharge, no skin change and no tenderness.  Abdominal: Soft. Bowel sounds are normal. She exhibits no mass. There is no tenderness.  Genitourinary: Vagina normal and uterus normal. No breast swelling, tenderness, discharge or bleeding. No vaginal discharge found.  Genitourinary Comments: bimanual exam-No adnexal masses or tenderness. Cervix parous and pink   Musculoskeletal: Normal range of motion.  Lymphadenopathy:    She has no cervical adenopathy.  Neurological: She is alert and oriented to person, place, and time.  Skin: Skin is warm and dry.  Psychiatric: She has a normal mood and affect. Her behavior is normal. Judgment and thought content normal.    BP 104/62   Pulse 65   Temp (!) 97.1 F (36.2 C) (Oral)   Ht 5\' 5"  (1.651 m)   Wt 124 lb (56.2 kg)   BMI 20.63 kg/m   EKG- sinus bradycardia-Mary-Margaret Hassell Done, FNP Chest xray- normal-Preliminary reading by Ronnald Collum, FNP  [facility]      Assessment & Plan:  1. Annual physical exam - Urinalysis, Complete - DG Chest 2 View; Future - EKG 12-Lead  2. Gynecologic exam normal  3. Gastroesophageal reflux disease without esophagitis Avoid spicy foods Do not eat 2 hours prior to bedtime  4. Osteopenia of lumbar spine Weight bearing exercise    Labs pending Health maintenance reviewed Diet and exercise encouraged Continue all meds Follow up  In 6 months- 1 year  Mary-Margaret Tecia Cinnamon, FNP   

## 2017-02-17 NOTE — Patient Instructions (Signed)
Bone Health Bones protect organs, store calcium, and anchor muscles. Good health habits, such as eating nutritious foods and exercising regularly, are important for maintaining healthy bones. They can also help to prevent a condition that causes bones to lose density and become weak and brittle (osteoporosis). Why is bone mass important? Bone mass refers to the amount of bone tissue that you have. The higher your bone mass, the stronger your bones. An important step toward having healthy bones throughout life is to have strong and dense bones during childhood. A young adult who has a high bone mass is more likely to have a high bone mass later in life. Bone mass at its greatest it is called peak bone mass. A large decline in bone mass occurs in older adults. In women, it occurs about the time of menopause. During this time, it is important to practice good health habits, because if more bone is lost than what is replaced, the bones will become less healthy and more likely to break (fracture). If you find that you have a low bone mass, you may be able to prevent osteoporosis or further bone loss by changing your diet and lifestyle. How can I find out if my bone mass is low? Bone mass can be measured with an X-ray test that is called a bone mineral density (BMD) test. This test is recommended for all women who are age 65 or older. It may also be recommended for men who are age 70 or older, or for people who are more likely to develop osteoporosis due to:  Having bones that break easily.  Having a long-term disease that weakens bones, such as kidney disease or rheumatoid arthritis.  Having menopause earlier than normal.  Taking medicine that weakens bones, such as steroids, thyroid hormones, or hormone treatment for breast cancer or prostate cancer.  Smoking.  Drinking three or more alcoholic drinks each day.  What are the nutritional recommendations for healthy bones? To have healthy bones, you  need to get enough of the right minerals and vitamins. Most nutrition experts recommend getting these nutrients from the foods that you eat. Nutritional recommendations vary from person to person. Ask your health care provider what is healthy for you. Here are some general guidelines. Calcium Recommendations Calcium is the most important (essential) mineral for bone health. Most people can get enough calcium from their diet, but supplements may be recommended for people who are at risk for osteoporosis. Good sources of calcium include:  Dairy products, such as low-fat or nonfat milk, cheese, and yogurt.  Dark green leafy vegetables, such as bok choy and broccoli.  Calcium-fortified foods, such as orange juice, cereal, bread, soy beverages, and tofu products.  Nuts, such as almonds.  Follow these recommended amounts for daily calcium intake:  Children, age 1?3: 700 mg.  Children, age 4?8: 1,000 mg.  Children, age 9?13: 1,300 mg.  Teens, age 14?18: 1,300 mg.  Adults, age 19?50: 1,000 mg.  Adults, age 51?70: ? Men: 1,000 mg. ? Women: 1,200 mg.  Adults, age 71 or older: 1,200 mg.  Pregnant and breastfeeding females: ? Teens: 1,300 mg. ? Adults: 1,000 mg.  Vitamin D Recommendations Vitamin D is the most essential vitamin for bone health. It helps the body to absorb calcium. Sunlight stimulates the skin to make vitamin D, so be sure to get enough sunlight. If you live in a cold climate or you do not get outside often, your health care provider may recommend that you take vitamin   D supplements. Good sources of vitamin D in your diet include:  Egg yolks.  Saltwater fish.  Milk and cereal fortified with vitamin D.  Follow these recommended amounts for daily vitamin D intake:  Children and teens, age 1?18: 600 international units.  Adults, age 50 or younger: 400-800 international units.  Adults, age 51 or older: 800-1,000 international units.  Other Nutrients Other nutrients  for bone health include:  Phosphorus. This mineral is found in meat, poultry, dairy foods, nuts, and legumes. The recommended daily intake for adult men and adult women is 700 mg.  Magnesium. This mineral is found in seeds, nuts, dark green vegetables, and legumes. The recommended daily intake for adult men is 400?420 mg. For adult women, it is 310?320 mg.  Vitamin K. This vitamin is found in green leafy vegetables. The recommended daily intake is 120 mg for adult men and 90 mg for adult women.  What type of physical activity is best for building and maintaining healthy bones? Weight-bearing and strength-building activities are important for building and maintaining peak bone mass. Weight-bearing activities cause muscles and bones to work against gravity. Strength-building activities increases muscle strength that supports bones. Weight-bearing and muscle-building activities include:  Walking and hiking.  Jogging and running.  Dancing.  Gym exercises.  Lifting weights.  Tennis and racquetball.  Climbing stairs.  Aerobics.  Adults should get at least 30 minutes of moderate physical activity on most days. Children should get at least 60 minutes of moderate physical activity on most days. Ask your health care provide what type of exercise is best for you. Where can I find more information? For more information, check out the following websites:  National Osteoporosis Foundation: http://nof.org/learn/basics  National Institutes of Health: http://www.niams.nih.gov/Health_Info/Bone/Bone_Health/bone_health_for_life.asp  This information is not intended to replace advice given to you by your health care provider. Make sure you discuss any questions you have with your health care provider. Document Released: 03/22/2003 Document Revised: 07/20/2015 Document Reviewed: 01/04/2014 Elsevier Interactive Patient Education  2018 Elsevier Inc.  

## 2017-02-17 NOTE — Addendum Note (Signed)
Addended by: Chevis Pretty on: 02/17/2017 12:05 PM   Modules accepted: Orders

## 2017-02-19 LAB — IGP, APTIMA HPV, RFX 16/18,45
HPV Aptima: NEGATIVE
PAP SMEAR COMMENT: 0

## 2017-03-03 DIAGNOSIS — L814 Other melanin hyperpigmentation: Secondary | ICD-10-CM | POA: Diagnosis not present

## 2017-03-03 DIAGNOSIS — L700 Acne vulgaris: Secondary | ICD-10-CM | POA: Diagnosis not present

## 2017-03-03 DIAGNOSIS — L821 Other seborrheic keratosis: Secondary | ICD-10-CM | POA: Diagnosis not present

## 2017-06-25 DIAGNOSIS — Z1231 Encounter for screening mammogram for malignant neoplasm of breast: Secondary | ICD-10-CM | POA: Diagnosis not present

## 2017-06-25 LAB — HM MAMMOGRAPHY

## 2017-06-29 DIAGNOSIS — Z008 Encounter for other general examination: Secondary | ICD-10-CM | POA: Diagnosis not present

## 2017-06-29 DIAGNOSIS — Z719 Counseling, unspecified: Secondary | ICD-10-CM | POA: Diagnosis not present

## 2017-06-29 DIAGNOSIS — Z1389 Encounter for screening for other disorder: Secondary | ICD-10-CM | POA: Diagnosis not present

## 2017-08-18 NOTE — Progress Notes (Signed)
In Care Everywhere  

## 2017-09-11 DIAGNOSIS — M25562 Pain in left knee: Secondary | ICD-10-CM | POA: Diagnosis not present

## 2017-10-05 ENCOUNTER — Ambulatory Visit: Payer: BLUE CROSS/BLUE SHIELD | Admitting: Family Medicine

## 2017-10-05 ENCOUNTER — Encounter: Payer: Self-pay | Admitting: Family Medicine

## 2017-10-05 VITALS — BP 112/65 | HR 58 | Temp 97.5°F | Ht 65.0 in | Wt 123.0 lb

## 2017-10-05 DIAGNOSIS — R42 Dizziness and giddiness: Secondary | ICD-10-CM

## 2017-10-05 NOTE — Progress Notes (Addendum)
Subjective:    Patient ID: [name], female    DOB: 26-Jan-1950, 67 y.o.   MRN: [id number]  Chief Complaint:  Syncopal episode (was at a fall festival on Saturday when this happened, had been walking and standing around when this happened, felt like she got hot; had dizziness this morning )   HPI: [name] is a 67 y.o. female presenting on 10/05/2017 for Syncopal episode (was at a fall festival on Saturday when this happened, had been walking and standing around when this happened, felt like she got hot; had dizziness this morning )  Pt states she experienced a possible syncopal episode on Saturday. States she was seen by EMS. Her EKG via EMS revealed a NSR and her FSBS was 122. Pt states when this happened she was at a festival and out in the heat all day. She states she only drinks around 20 oz of water per day and then sodas. She states she had an episode of slight dizziness and nausea this morning after getting up from breakfast. She denies blurred vision, chest pain, palpitations, shortness of breath, weakness, slurred speech, or confusion. She denies any abnormal bleeding or dark tarry stools.   1. Dizziness      Relevant past medical, surgical, family and social history reviewed and updated as indicated. Interim medical history since our last visit reviewed. Allergies and medications reviewed and updated. DATA REVIEWED: CHART IN EPIC  Family History reviewed for pertinent findings.  Past Medical History:  Diagnosis Date  . GERD (gastroesophageal reflux disease) 02/13/2013  . Heart murmur   . Hematuria   . Lesion of bladder   . Renal insufficiency 02/12/2013    Past Surgical History:  Procedure Laterality Date  . APPENDECTOMY  03-09-2008  . CHOLECYSTECTOMY  1990  . CHOLECYSTECTOMY    . CYSTOSCOPY W/ RETROGRADES Bilateral 01/24/2013   Procedure: CYSTOSCOPY WITH RETROGRADE PYELOGRAM;  Surgeon: Molli Hazard, MD;  Location: [facility];   Service: Urology;  Laterality: Bilateral;  . CYSTOSCOPY WITH BIOPSY N/A 01/24/2013   Procedure: CYSTOSCOPY WITH BIOPSY;  Surgeon: Molli Hazard, MD;  Location: [facility];  Service: Urology;  Laterality: N/A;  . LAPAROSCOPIC APPENDECTOMY N/A 2011  . OPEN HIATAL HERNIA REPAIR  1985  . TUBAL LIGATION    . TUBAL LIGATION Bilateral     Social History   Socioeconomic History  . Marital status: Married    Spouse name: Not on file  . Number of children: Not on file  . Years of education: Not on file  . Highest education level: Not on file  Occupational History  . Not on file  Social Needs  . Financial resource strain: Not on file  . Food insecurity:    Worry: Not on file    Inability: Not on file  . Transportation needs:    Medical: Not on file    Non-medical: Not on file  Tobacco Use  . Smoking status: Never Smoker  . Smokeless tobacco: Never Used  Substance and Sexual Activity  . Alcohol use: No  . Drug use: No  . Sexual activity: Yes    Birth control/protection: Post-menopausal  Lifestyle  . Physical activity:    Days per week: Not on file    Minutes per session: Not on file  . Stress: Not on file  Relationships  . Social connections:    Talks on phone: Not on file    Gets together: Not on file  Attends religious service: Not on file    Active member of club or organization: Not on file    Attends meetings of clubs or organizations: Not on file    Relationship status: Not on file  . Intimate partner violence:    Fear of current or ex partner: Not on file    Emotionally abused: Not on file    Physically abused: Not on file    Forced sexual activity: Not on file  Other Topics Concern  . Not on file  Social History Narrative  . Not on file    No outpatient encounter medications on file as of 10/05/2017.   No facility-administered encounter medications on file as of 10/05/2017.     Allergies  Allergen Reactions  . Asa [Aspirin] Other  (See Comments)    Contraindicated for hernia  . Ciprofloxacin Hcl Other (See Comments)    Burning  both legs below knees as if on fire.  . Septra Ds [Sulfamethoxazole-Trimethoprim]     Rapid HR, sweats , and low blood sugar  . Tagamet [Cimetidine] Rash    Review of Systems  Constitutional: Negative for activity change, appetite change, chills, diaphoresis, fatigue and fever.  HENT: Negative for sinus pressure and sinus pain.   Eyes: Negative for photophobia and visual disturbance.  Respiratory: Negative for cough, chest tightness and shortness of breath.   Cardiovascular: Negative for chest pain, palpitations and leg swelling.  Gastrointestinal: Positive for nausea. Negative for abdominal pain, constipation, diarrhea and vomiting.  Musculoskeletal: Negative for gait problem.  Neurological: Positive for dizziness and syncope (possible episode on Saturday). Negative for seizures, facial asymmetry, speech difficulty, weakness, light-headedness, numbness and headaches.  Psychiatric/Behavioral: Negative for confusion.  All other systems reviewed and are negative.       Objective:    BP 112/65   Pulse (!) 58   Temp (!) 97.5 F (36.4 C) (Oral)   Ht 5' 5"  (1.651 m)   Wt 123 lb (55.8 kg)   BMI 20.47 kg/m    Orthostatic VS for the past 24 hrs:  BP- Lying Pulse- Lying BP- Sitting Pulse- Sitting BP- Standing at 0 minutes Pulse- Standing at 0 minutes  10/05/17 0828 131/76 70 128/72 69 124/65 69     Wt Readings from Last 3 Encounters:  10/05/17 123 lb (55.8 kg)  02/17/17 124 lb (56.2 kg)  01/29/16 120 lb (54.4 kg)    Physical Exam  Constitutional: She is oriented to person, place, and time. She appears well-developed and well-nourished. No distress.  HENT:  Head: Normocephalic and atraumatic.  Right Ear: Hearing, tympanic membrane, external ear and ear canal normal.  Left Ear: Hearing, tympanic membrane, external ear and ear canal normal.  Nose: Nose normal.  Mouth/Throat:  Uvula is midline, oropharynx is clear and moist and mucous membranes are normal. Mucous membranes are not pale, not dry and not cyanotic.  Eyes: Pupils are equal, round, and reactive to light. Conjunctivae and EOM are normal. Right eye exhibits no nystagmus. Left eye exhibits no nystagmus.  Neck: Trachea normal, normal range of motion and phonation normal. Neck supple. No JVD present. Carotid bruit is not present.  Cardiovascular: Normal rate, regular rhythm, S1 normal, S2 normal, normal heart sounds, intact distal pulses and normal pulses.  Pulmonary/Chest: Effort normal and breath sounds normal.  Neurological: She is alert and oriented to person, place, and time. She has normal strength and normal reflexes. No cranial nerve deficit or sensory deficit. She displays a negative Romberg sign. GCS eye  subscore is 4. GCS verbal subscore is 5. GCS motor subscore is 6.  Unable to illicit nystagmus with positional maneuvers   Skin: Skin is warm, dry and intact. Capillary refill takes less than 2 seconds. She is not diaphoretic. No pallor.  Decreased skin turgor  Psychiatric: She has a normal mood and affect. Her speech is normal and behavior is normal. Judgment and thought content normal. Cognition and memory are normal.  Nursing note and vitals reviewed.       Assessment & Plan:   1. Dizziness Possible dehydration vs vertigo. Labs pending. Increase water intake. Slow transitions from sitting to standing. Pt declined meclizine, states she will take OTC dramamine if needed. Pt aware to report any worsening symptoms. - CBC with Differential/Platelet - CMP14+EGFR   Continue all other maintenance medications.  Follow up plan: Return in about 4 weeks (around 11/02/2017), or if symptoms worsen or fail to improve.  Educational handout given for dehydration and dizziness  The above assessment and management plan was discussed with the patient. The patient verbalized understanding of and has agreed to  the management plan. Patient is aware to call the clinic if symptoms persist or worsen. Patient is aware when to return to the clinic for a follow-up visit. Patient educated on when it is appropriate to go to the emergency department.   Monia Pouch, FNP-C [facility] [phone]

## 2017-10-05 NOTE — Patient Instructions (Signed)
Dehydration, Adult Dehydration is when there is not enough fluid or water in your body. This happens when you lose more fluids than you take in. Dehydration can range from mild to very bad. It should be treated right away to keep it from getting very bad. Symptoms of mild dehydration may include:  Thirst.  Dry lips.  Slightly dry mouth.  Dry, warm skin.  Dizziness. Symptoms of moderate dehydration may include:  Very dry mouth.  Muscle cramps.  Dark pee (urine). Pee may be the color of tea.  Your body making less pee.  Your eyes making fewer tears.  Heartbeat that is uneven or faster than normal (palpitations).  Headache.  Light-headedness, especially when you stand up from sitting.  Fainting (syncope). Symptoms of very bad dehydration may include:  Changes in skin, such as: ? Cold and clammy skin. ? Blotchy (mottled) or pale skin. ? Skin that does not quickly return to normal after being lightly pinched and let go (poor skin turgor).  Changes in body fluids, such as: ? Feeling very thirsty. ? Your eyes making fewer tears. ? Not sweating when body temperature is high, such as in hot weather. ? Your body making very little pee.  Changes in vital signs, such as: ? Weak pulse. ? Pulse that is more than 100 beats a minute when you are sitting still. ? Fast breathing. ? Low blood pressure.  Other changes, such as: ? Sunken eyes. ? Cold hands and feet. ? Confusion. ? Lack of energy (lethargy). ? Trouble waking up from sleep. ? Short-term weight loss. ? Unconsciousness. Follow these instructions at home:  If told by your doctor, drink an ORS: ? Make an ORS by using instructions on the package. ? Start by drinking small amounts, about  cup (120 mL) every 5-10 minutes. ? Slowly drink more until you have had the amount that your doctor said to have.  Drink enough clear fluid to keep your pee clear or pale yellow. If you were told to drink an ORS, finish the ORS  first, then start slowly drinking clear fluids. Drink fluids such as: ? Water. Do not drink only water by itself. Doing that can make the salt (sodium) level in your body get too low (hyponatremia). ? Ice chips. ? Fruit juice that you have added water to (diluted). ? Low-calorie sports drinks.  Avoid: ? Alcohol. ? Drinks that have a lot of sugar. These include high-calorie sports drinks, fruit juice that does not have water added, and soda. ? Caffeine. ? Foods that are greasy or have a lot of fat or sugar.  Take over-the-counter and prescription medicines only as told by your doctor.  Do not take salt tablets. Doing that can make the salt level in your body get too high (hypernatremia).  Eat foods that have minerals (electrolytes). Examples include bananas, oranges, potatoes, tomatoes, and spinach.  Keep all follow-up visits as told by your doctor. This is important. Contact a doctor if:  You have belly (abdominal) pain that: ? Gets worse. ? Stays in one area (localizes).  You have a rash.  You have a stiff neck.  You get angry or annoyed more easily than normal (irritability).  You are more sleepy than normal.  You have a harder time waking up than normal.  You feel: ? Weak. ? Dizzy. ? Very thirsty.  You have peed (urinated) only a small amount of very dark pee during 6-8 hours. Get help right away if:  You have symptoms of   very bad dehydration.  You cannot drink fluids without throwing up (vomiting).  Your symptoms get worse with treatment.  You have a fever.  You have a very bad headache.  You are throwing up or having watery poop (diarrhea) and it: ? Gets worse. ? Does not go away.  You have blood or something green (bile) in your throw-up.  You have blood in your poop (stool). This may cause poop to look black and tarry.  You have not peed in 6-8 hours.  You pass out (faint).  Your heart rate when you are sitting still is more than 100 beats a  minute.  You have trouble breathing. This information is not intended to replace advice given to you by your health care provider. Make sure you discuss any questions you have with your health care provider. Document Released: 10/26/2008 Document Revised: 07/20/2015 Document Reviewed: 02/23/2015 Elsevier Interactive Patient Education  2018 [employer]. Dizziness Dizziness is a common problem. It makes you feel unsteady or light-headed. You may feel like you are about to pass out (faint). Dizziness can lead to getting hurt if you stumble or fall. Dizziness can be caused by many things, including:  Medicines.  Not having enough water in your body (dehydration).  Illness.  Follow these instructions at home: Eating and drinking  Drink enough fluid to keep your pee (urine) clear or pale yellow. This helps to keep you from getting dehydrated. Try to drink more clear fluids, such as water.  Do not drink alcohol.  Limit how much caffeine you drink or eat, if your doctor tells you to do that.  Limit how much salt (sodium) you drink or eat, if your doctor tells you to do that. Activity  Avoid making quick movements. ? When you stand up from sitting in a chair, steady yourself until you feel okay. ? In the morning, first sit up on the side of the bed. When you feel okay, stand slowly while you hold onto something. Do this until you know that your balance is fine.  If you need to stand in one place for a long time, move your legs often. Tighten and relax the muscles in your legs while you are standing.  Do not drive or use heavy machinery if you feel dizzy.  Avoid bending down if you feel dizzy. Place items in your home so you can reach them easily without leaning over. Lifestyle  Do not use any products that contain nicotine or tobacco, such as cigarettes and e-cigarettes. If you need help quitting, ask your doctor.  Try to lower your stress level. You can do this by using methods such  as yoga or meditation. Talk with your doctor if you need help. General instructions  Watch your dizziness for any changes.  Take over-the-counter and prescription medicines only as told by your doctor. Talk with your doctor if you think that you are dizzy because of a medicine that you are taking.  Tell a friend or a family member that you are feeling dizzy. If he or she notices any changes in your behavior, have this person call your doctor.  Keep all follow-up visits as told by your doctor. This is important. Contact a doctor if:  Your dizziness does not go away.  Your dizziness or light-headedness gets worse.  You feel sick to your stomach (nauseous).  You have trouble hearing.  You have new symptoms.  You are unsteady on your feet.  You feel like the room is spinning.  Get help right away if:  You throw up (vomit) or have watery poop (diarrhea), and you cannot eat or drink anything.  You have trouble: ? Talking. ? Walking. ? Swallowing. ? Using your arms, hands, or legs.  You feel generally weak.  You are not thinking clearly, or you have trouble forming sentences. A friend or family member may notice this.  You have: ? Chest pain. ? Pain in your belly (abdomen). ? Shortness of breath. ? Sweating.  Your vision changes.  You are bleeding.  You have a very bad headache.  You have neck pain or a stiff neck.  You have a fever. These symptoms may be an emergency. Do not wait to see if the symptoms will go away. Get medical help right away. Call your local emergency services (911 in the [place] Do not drive yourself to the hospital. Summary  Dizziness makes you feel unsteady or light-headed. You may feel like you are about to pass out (faint).  Drink enough fluid to keep your pee (urine) clear or pale yellow. Do not drink alcohol.  Avoid making quick movements if you feel dizzy.  Watch your dizziness for any changes. This information is not intended to  replace advice given to you by your health care provider. Make sure you discuss any questions you have with your health care provider. Document Released: 12/19/2010 Document Revised: 01/17/2016 Document Reviewed: 01/17/2016 Elsevier Interactive Patient Education  2017 [employer].

## 2017-10-06 ENCOUNTER — Other Ambulatory Visit: Payer: Self-pay

## 2017-10-06 DIAGNOSIS — N289 Disorder of kidney and ureter, unspecified: Secondary | ICD-10-CM

## 2017-10-06 LAB — CBC WITH DIFFERENTIAL/PLATELET
BASOS ABS: 0 10*3/uL (ref 0.0–0.2)
Basos: 1 %
EOS (ABSOLUTE): 0.1 10*3/uL (ref 0.0–0.4)
Eos: 1 %
Hematocrit: 35.4 % (ref 34.0–46.6)
Hemoglobin: 12.1 g/dL (ref 11.1–15.9)
IMMATURE GRANS (ABS): 0 10*3/uL (ref 0.0–0.1)
Immature Granulocytes: 0 %
LYMPHS: 15 %
Lymphocytes Absolute: 1 10*3/uL (ref 0.7–3.1)
MCH: 31.8 pg (ref 26.6–33.0)
MCHC: 34.2 g/dL (ref 31.5–35.7)
MCV: 93 fL (ref 79–97)
Monocytes Absolute: 0.6 10*3/uL (ref 0.1–0.9)
Monocytes: 9 %
NEUTROS ABS: 4.7 10*3/uL (ref 1.4–7.0)
NEUTROS PCT: 74 %
PLATELETS: 215 10*3/uL (ref 150–450)
RBC: 3.81 x10E6/uL (ref 3.77–5.28)
RDW: 11.5 % — ABNORMAL LOW (ref 12.3–15.4)
WBC: 6.4 10*3/uL (ref 3.4–10.8)

## 2017-10-06 LAB — CMP14+EGFR
ALK PHOS: 68 IU/L (ref 39–117)
ALT: 10 IU/L (ref 0–32)
AST: 10 IU/L (ref 0–40)
Albumin/Globulin Ratio: 1.8 (ref 1.2–2.2)
Albumin: 4.3 g/dL (ref 3.6–4.8)
BILIRUBIN TOTAL: 0.7 mg/dL (ref 0.0–1.2)
BUN/Creatinine Ratio: 14 (ref 12–28)
BUN: 17 mg/dL (ref 8–27)
CHLORIDE: 104 mmol/L (ref 96–106)
CO2: 22 mmol/L (ref 20–29)
Calcium: 9.6 mg/dL (ref 8.7–10.3)
Creatinine, Ser: 1.2 mg/dL — ABNORMAL HIGH (ref 0.57–1.00)
GFR calc Af Amer: 55 mL/min/{1.73_m2} — ABNORMAL LOW (ref >59)
GFR calc non Af Amer: 48 mL/min/{1.73_m2} — ABNORMAL LOW (ref >59)
GLUCOSE: 88 mg/dL (ref 65–99)
Globulin, Total: 2.4 g/dL (ref 1.5–4.5)
Potassium: 4.3 mmol/L (ref 3.5–5.2)
Sodium: 142 mmol/L (ref 134–144)
TOTAL PROTEIN: 6.7 g/dL (ref 6.0–8.5)

## 2017-10-08 ENCOUNTER — Telehealth: Payer: Self-pay | Admitting: Nurse Practitioner

## 2017-10-08 NOTE — Telephone Encounter (Signed)
Please advise 

## 2017-10-09 ENCOUNTER — Ambulatory Visit: Payer: BLUE CROSS/BLUE SHIELD | Admitting: Family Medicine

## 2017-10-09 ENCOUNTER — Encounter: Payer: Self-pay | Admitting: Family Medicine

## 2017-10-09 VITALS — BP 118/70 | HR 71 | Temp 98.7°F | Ht 65.0 in | Wt 123.0 lb

## 2017-10-09 DIAGNOSIS — R450 Nervousness: Secondary | ICD-10-CM | POA: Diagnosis not present

## 2017-10-09 DIAGNOSIS — F411 Generalized anxiety disorder: Secondary | ICD-10-CM | POA: Insufficient documentation

## 2017-10-09 DIAGNOSIS — R42 Dizziness and giddiness: Secondary | ICD-10-CM

## 2017-10-09 MED ORDER — MECLIZINE HCL 12.5 MG PO TABS: 12.5000 mg | ORAL_TABLET | Freq: Three times a day (TID) | ORAL | 0 refills | Status: DC | PRN

## 2017-10-09 NOTE — Patient Instructions (Signed)
Stress and Stress Management Stress is a normal reaction to life events. It is what you feel when life demands more than you are used to or more than you can handle. Some stress can be useful. For example, the stress reaction can help you catch the last bus of the day, study for a test, or meet a deadline at work. But stress that occurs too often or for too long can cause problems. It can affect your emotional health and interfere with relationships and normal daily activities. Too much stress can weaken your immune system and increase your risk for physical illness. If you already have a medical problem, stress can make it worse. What are the causes? All sorts of life events may cause stress. An event that causes stress for one person may not be stressful for another person. Major life events commonly cause stress. These may be positive or negative. Examples include losing your job, moving into a new home, getting married, having a baby, or losing a loved one. Less obvious life events may also cause stress, especially if they occur day after day or in combination. Examples include working long hours, driving in traffic, caring for children, being in debt, or being in a difficult relationship. What are the signs or symptoms? Stress may cause emotional symptoms including, the following:  Anxiety. This is feeling worried, afraid, on edge, overwhelmed, or out of control.  Anger. This is feeling irritated or impatient.  Depression. This is feeling sad, down, helpless, or guilty.  Difficulty focusing, remembering, or making decisions.  Stress may cause physical symptoms, including the following:  Aches and pains. These may affect your head, neck, back, stomach, or other areas of your body.  Tight muscles or clenched jaw.  Low energy or trouble sleeping.  Stress may cause unhealthy behaviors, including the following:  Eating to feel better (overeating) or skipping meals.  Sleeping too little,  too much, or both.  Working too much or putting off tasks (procrastination).  Smoking, drinking alcohol, or using drugs to feel better.  How is this diagnosed? Stress is diagnosed through an assessment by your health care provider. Your health care provider will ask questions about your symptoms and any stressful life events.Your health care provider will also ask about your medical history and may order blood tests or other tests. Certain medical conditions and medicine can cause physical symptoms similar to stress. Mental illness can cause emotional symptoms and unhealthy behaviors similar to stress. Your health care provider may refer you to a mental health professional for further evaluation. How is this treated? Stress management is the recommended treatment for stress.The goals of stress management are reducing stressful life events and coping with stress in healthy ways. Techniques for reducing stressful life events include the following:  Stress identification. Self-monitor for stress and identify what causes stress for you. These skills may help you to avoid some stressful events.  Time management. Set your priorities, keep a calendar of events, and learn to say "no." These tools can help you avoid making too many commitments.  Techniques for coping with stress include the following:  Rethinking the problem. Try to think realistically about stressful events rather than ignoring them or overreacting. Try to find the positives in a stressful situation rather than focusing on the negatives.  Exercise. Physical exercise can release both physical and emotional tension. The key is to find a form of exercise you enjoy and do it regularly.  Relaxation techniques. These relax the body and  mind. Examples include yoga, meditation, tai chi, biofeedback, deep breathing, progressive muscle relaxation, listening to music, being out in nature, journaling, and other hobbies. Again, the key is to find  one or more that you enjoy and can do regularly.  Healthy lifestyle. Eat a balanced diet, get plenty of sleep, and do not smoke. Avoid using alcohol or drugs to relax.  Strong support network. Spend time with family, friends, or other people you enjoy being around.Express your feelings and talk things over with someone you trust.  Counseling or talktherapy with a mental health professional may be helpful if you are having difficulty managing stress on your own. Medicine is typically not recommended for the treatment of stress.Talk to your health care provider if you think you need medicine for symptoms of stress. Follow these instructions at home:  Keep all follow-up visits as directed by your health care provider.  Take all medicines as directed by your health care provider. Contact a health care provider if:  Your symptoms get worse or you start having new symptoms.  You feel overwhelmed by your problems and can no longer manage them on your own. Get help right away if:  You feel like hurting yourself or someone else. This information is not intended to replace advice given to you by your health care provider. Make sure you discuss any questions you have with your health care provider. Document Released: 06/25/2000 Document Revised: 06/07/2015 Document Reviewed: 08/24/2012 Elsevier Interactive Patient Education  2017 [employer]  

## 2017-10-09 NOTE — Progress Notes (Signed)
Subjective:    Patient ID: [name], female    DOB: [birth year], 67 y.o.   MRN: [id number]  Chief Complaint:  Feels lightheaded, nervous, hypotension   HPI: [name] is a 67 y.o. female presenting on 10/09/2017 for Feels lightheaded, nervous, hypotension  Pt presents today for follow-up from 10/05/17 visit. Pt states she continues to have intermittent dizziness. States this may happen once very other day. She reports she has not increased her water intake as discussed. She reports she was unable to find the dramamine over the counter. She states the dizziness occurs mostly when she goes from a sitting to standing position. Pt reports she has not experienced any numbness, tingling, headaches, syncope, weakness, facial drooping, confusion, or loss of function.  Pt states yesterday when she checked her blood pressure it was 98/65. Pt states she was asymptomatic but this number scared her and made her feel nervous.  Pt states today she became very nervous while watching her husband work on his tractor. States she is concerned about his health due to a recent CVA. She reports the nervousness lasted for a brief period of time and she felt jittery and like her hear was racing during the episode. Pt states this passed after a few minutes. She denies chest pain, shortness of breath, or syncope with the nervousness.   Relevant past medical, surgical, family and social history reviewed and updated as indicated. Interim medical history since our last visit reviewed. Allergies and medications reviewed and updated. DATA REVIEWED: CHART IN EPIC  Family History reviewed for pertinent findings.  Past Medical History:  Diagnosis Date  . GERD (gastroesophageal reflux disease) 02/13/2013  . Heart murmur   . Hematuria   . Lesion of bladder   . Renal insufficiency 02/12/2013    Past Surgical History:  Procedure Laterality Date  . APPENDECTOMY  03-09-2008  . CHOLECYSTECTOMY  1990  .  CHOLECYSTECTOMY    . CYSTOSCOPY W/ RETROGRADES Bilateral 01/24/2013   Procedure: CYSTOSCOPY WITH RETROGRADE PYELOGRAM;  Surgeon: Molli Hazard, MD;  Location: [facility];  Service: Urology;  Laterality: Bilateral;  . CYSTOSCOPY WITH BIOPSY N/A 01/24/2013   Procedure: CYSTOSCOPY WITH BIOPSY;  Surgeon: Molli Hazard, MD;  Location: [facility];  Service: Urology;  Laterality: N/A;  . LAPAROSCOPIC APPENDECTOMY N/A 2011  . OPEN HIATAL HERNIA REPAIR  1985  . TUBAL LIGATION    . TUBAL LIGATION Bilateral     Social History   Socioeconomic History  . Marital status: Married    Spouse name: Not on file  . Number of children: Not on file  . Years of education: Not on file  . Highest education level: Not on file  Occupational History  . Not on file  Social Needs  . Financial resource strain: Not on file  . Food insecurity:    Worry: Not on file    Inability: Not on file  . Transportation needs:    Medical: Not on file    Non-medical: Not on file  Tobacco Use  . Smoking status: Never Smoker  . Smokeless tobacco: Never Used  Substance and Sexual Activity  . Alcohol use: No  . Drug use: No  . Sexual activity: Yes    Birth control/protection: Post-menopausal  Lifestyle  . Physical activity:    Days per week: Not on file    Minutes per session: Not on file  . Stress: Not on file  Relationships  . Social  connections:    Talks on phone: Not on file    Gets together: Not on file    Attends religious service: Not on file    Active member of club or organization: Not on file    Attends meetings of clubs or organizations: Not on file    Relationship status: Not on file  . Intimate partner violence:    Fear of current or ex partner: Not on file    Emotionally abused: Not on file    Physically abused: Not on file    Forced sexual activity: Not on file  Other Topics Concern  . Not on file  Social History Narrative  . Not on file     Outpatient Encounter Medications as of 10/09/2017  Medication Sig  . meclizine (ANTIVERT) 12.5 MG tablet Take 1 tablet (12.5 mg total) by mouth 3 (three) times daily as needed for dizziness.   No facility-administered encounter medications on file as of 10/09/2017.     Allergies  Allergen Reactions  . Asa [Aspirin] Other (See Comments)    Contraindicated for hernia  . Ciprofloxacin Hcl Other (See Comments)    Burning  both legs below knees as if on fire.  . Septra Ds [Sulfamethoxazole-Trimethoprim]     Rapid HR, sweats , and low blood sugar  . Tagamet [Cimetidine] Rash    Review of Systems  Constitutional: Negative for appetite change, chills, fatigue and fever.  HENT: Negative for congestion, ear discharge, ear pain and tinnitus.   Eyes: Negative for photophobia and visual disturbance.  Respiratory: Negative for cough, chest tightness and shortness of breath.   Cardiovascular: Positive for palpitations. Negative for chest pain.  Gastrointestinal: Negative for abdominal pain.  Endocrine: Negative for cold intolerance, heat intolerance, polydipsia, polyphagia and polyuria.  Neurological: Positive for dizziness and light-headedness. Negative for tremors, seizures, syncope, facial asymmetry, speech difficulty, weakness, numbness and headaches.  Psychiatric/Behavioral: Negative for confusion, decreased concentration and suicidal ideas. The patient is nervous/anxious.   All other systems reviewed and are negative.       Objective:    BP 118/70   Pulse 71   Temp 98.7 F (37.1 C) (Oral)   Ht 5' 5"  (1.651 m)   Wt 123 lb (55.8 kg)   BMI 20.47 kg/m    Wt Readings from Last 3 Encounters:  10/09/17 123 lb (55.8 kg)  10/05/17 123 lb (55.8 kg)  02/17/17 124 lb (56.2 kg)    Physical Exam  Constitutional: She appears well-developed and well-nourished. She is cooperative. No distress.  HENT:  Head: Normocephalic and atraumatic.  Right Ear: Hearing, tympanic membrane, external  ear and ear canal normal.  Left Ear: Hearing, tympanic membrane, external ear and ear canal normal.  Nose: Nose normal.  Mouth/Throat: Uvula is midline, oropharynx is clear and moist and mucous membranes are normal.  Eyes: Pupils are equal, round, and reactive to light. Conjunctivae, EOM and lids are normal. Right eye exhibits no nystagmus. Left eye exhibits no nystagmus.  Neck: Full passive range of motion without pain. Neck supple. Normal carotid pulses and no JVD present. Carotid bruit is not present. No thyroid mass and no thyromegaly present.  Cardiovascular: Normal rate, regular rhythm, S1 normal, S2 normal, normal heart sounds and normal pulses. Exam reveals no gallop and no friction rub.  No murmur heard. Pulmonary/Chest: Effort normal and breath sounds normal.  Lymphadenopathy:    She has no cervical adenopathy.  Neurological: She is alert. She has normal strength and normal reflexes. No cranial  nerve deficit or sensory deficit. She displays a negative Romberg sign. GCS eye subscore is 4. GCS verbal subscore is 5. GCS motor subscore is 6.  Skin: Skin is warm, dry and intact. Capillary refill takes less than 2 seconds.  Psychiatric: Her behavior is normal. Judgment and thought content normal. Her mood appears anxious (mildly). Cognition and memory are normal.  Nursing note and vitals reviewed.   Results for orders placed or performed in visit on 10/05/17  CBC with Differential/Platelet  Result Value Ref Range   WBC 6.4 3.4 - 10.8 x10E3/uL   RBC 3.81 3.77 - 5.28 x10E6/uL   Hemoglobin 12.1 11.1 - 15.9 g/dL   Hematocrit 35.4 34.0 - 46.6 %   MCV 93 79 - 97 fL   MCH 31.8 26.6 - 33.0 pg   MCHC 34.2 31.5 - 35.7 g/dL   RDW 11.5 (L) 12.3 - 15.4 %   Platelets 215 150 - 450 x10E3/uL   Neutrophils 74 Not Estab. %   Lymphs 15 Not Estab. %   Monocytes 9 Not Estab. %   Eos 1 Not Estab. %   Basos 1 Not Estab. %   Neutrophils Absolute 4.7 1.4 - 7.0 x10E3/uL   Lymphocytes Absolute 1.0 0.7 -  3.1 x10E3/uL   Monocytes Absolute 0.6 0.1 - 0.9 x10E3/uL   EOS (ABSOLUTE) 0.1 0.0 - 0.4 x10E3/uL   Basophils Absolute 0.0 0.0 - 0.2 x10E3/uL   Immature Granulocytes 0 Not Estab. %   Immature Grans (Abs) 0.0 0.0 - 0.1 x10E3/uL  CMP14+EGFR  Result Value Ref Range   Glucose 88 65 - 99 mg/dL   BUN 17 8 - 27 mg/dL   Creatinine, Ser 1.20 (H) 0.57 - 1.00 mg/dL   GFR calc non Af Amer 48 (L) >59 mL/min/1.73   GFR calc Af Amer 55 (L) >59 mL/min/1.73   BUN/Creatinine Ratio 14 12 - 28   Sodium 142 134 - 144 mmol/L   Potassium 4.3 3.5 - 5.2 mmol/L   Chloride 104 96 - 106 mmol/L   CO2 22 20 - 29 mmol/L   Calcium 9.6 8.7 - 10.3 mg/dL   Total Protein 6.7 6.0 - 8.5 g/dL   Albumin 4.3 3.6 - 4.8 g/dL   Globulin, Total 2.4 1.5 - 4.5 g/dL   Albumin/Globulin Ratio 1.8 1.2 - 2.2   Bilirubin Total 0.7 0.0 - 1.2 mg/dL   Alkaline Phosphatase 68 39 - 117 IU/L   AST 10 0 - 40 IU/L   ALT 10 0 - 32 IU/L      Assessment & Plan:   1. Nervousness Borderline low TSH in the past. Will check today to see if possible cause of palpitations and nervousness.  - TSH  2. Dizziness No neurological deficits present. No carotid bruit.  Increase water intake. Slow positional changes. Medications as prescribed. Return in 4 weeks for reevaluation and repeat kidney function testing.  - meclizine (ANTIVERT) 12.5 MG tablet; Take 1 tablet (12.5 mg total) by mouth 3 (three) times daily as needed for dizziness.  Dispense: 30 tablet; Refill: 0   Continue all other maintenance medications.  Follow up plan: Return in about 4 weeks (around 11/06/2017), or if symptoms worsen or fail to improve.  Educational handout given for stress and stress management.  The above assessment and management plan was discussed with the patient. The patient verbalized understanding of and has agreed to the management plan. Patient is aware to call the clinic if symptoms persist or worsen. Patient is aware when to  return to the clinic for a  follow-up visit. Patient educated on when it is appropriate to go to the emergency department.   Monia Pouch, FNP-C [facility] [phone]

## 2017-10-10 LAB — TSH: TSH: 1.15 u[IU]/mL (ref 0.450–4.500)

## 2017-10-12 ENCOUNTER — Ambulatory Visit: Payer: BLUE CROSS/BLUE SHIELD

## 2017-11-09 ENCOUNTER — Ambulatory Visit: Payer: BLUE CROSS/BLUE SHIELD | Admitting: Family Medicine

## 2017-11-09 ENCOUNTER — Encounter: Payer: Self-pay | Admitting: Family Medicine

## 2017-11-09 VITALS — BP 134/72 | HR 60 | Temp 97.6°F | Resp 20 | Ht 65.0 in | Wt 124.6 lb

## 2017-11-09 DIAGNOSIS — J0110 Acute frontal sinusitis, unspecified: Secondary | ICD-10-CM

## 2017-11-09 MED ORDER — FLUTICASONE PROPIONATE 50 MCG/ACT NA SUSP: 1.0000 | Freq: Two times a day (BID) | NASAL | 6 refills | Status: DC | PRN

## 2017-11-09 NOTE — Progress Notes (Signed)
BP 134/72   Pulse 60   Temp 97.6 F (36.4 C) (Oral)   Resp 20   Ht 5\' 5"  (1.651 m)   Wt 56.5 kg   SpO2 99%   BMI 20.73 kg/m    Subjective:    Patient ID: [name], female    DOB: 07-Oct-1950, 67 y.o.   MRN: [id number]  HPI: [name] is a 67 y.o. female presenting on 11/09/2017 for cough, congestion, headache x 3 days.  Symptoms have been progressively worsening over the last couple days. Cough is a dry cough, worse at night. She feels very congested and feels like her voice is changing. Her headache has been moderate to severe pain all over. Associated symptom includes itchy eyes bilaterally and itchy throat. Patient took Tylenol sinus pills and Dayquil which didn't seem to help. She denies fever, chills, body aches, fatigue, chest pain, sob, or wheezing. She denies a history of seasonal allergies or sinus problems.    Relevant past medical, surgical, family and social history reviewed and updated as indicated. Interim medical history since our last visit reviewed. Allergies and medications reviewed and updated.  Review of Systems  Constitutional: Negative for chills, fatigue and fever.  HENT: Positive for congestion, sore throat and voice change. Negative for ear pain, postnasal drip, rhinorrhea and sinus pressure.   Eyes: Positive for itching. Negative for pain and redness.  Respiratory: Positive for cough. Negative for shortness of breath and wheezing.   Cardiovascular: Negative for chest pain.  Gastrointestinal: Negative.   Skin: Negative.   Allergic/Immunologic: Negative for environmental allergies.  Neurological: Positive for headaches. Negative for dizziness.    Per HPI unless specifically indicated above      Objective:    BP 134/72   Pulse 60   Temp 97.6 F (36.4 C) (Oral)   Resp 20   Ht 5\' 5"  (1.651 m)   Wt 56.5 kg   SpO2 99%   BMI 20.73 kg/m   Wt Readings from Last 3 Encounters:  11/09/17 56.5 kg  10/09/17 55.8 kg  10/05/17 55.8 kg      Physical Exam  Constitutional: She is oriented to person, place, and time. She appears well-developed and well-nourished. No distress.  HENT:  Head: Normocephalic and atraumatic.  Mouth/Throat: Oropharynx is clear and moist.  Eyes: EOM are normal. No scleral icterus.  Neck: Neck supple.  Cardiovascular: Normal rate, regular rhythm, normal heart sounds and intact distal pulses.  Pulmonary/Chest: Effort normal and breath sounds normal.  Neurological: She is alert and oriented to person, place, and time.  Skin: Skin is warm and dry. No rash noted.  Psychiatric: She has a normal mood and affect.      Assessment & Plan:   Problem List Items Addressed This Visit    None    Visit Diagnoses    Acute non-recurrent frontal sinusitis    -  Primary   Relevant Medications   fluticasone (FLONASE) 50 MCG/ACT nasal spray    Sinusisits  Patient presents with 3 days of progressive cough, congestion, and headache. She tried tylenol sinus and dayquil with no relief from symptoms. She denies fever, chills, body, aches, chest pain, or sob. She has no history of seasonal allergies or sinus problems. I recommend patient try Flonase twice daily to help clear sinuses. Continue taking dayquil daily. Return or call in 3-4 days if not improved.   Follow up plan: Return if symptoms worsen or fail to improve.  Counseling provided for all of  the vaccine components No orders of the defined types were placed in this encounter.  Patient seen and examined with Cadence Kathlen Mody PA student, agree with assessment and plan above, will manage conservatively and have patient call back if not improved Caryl Pina, MD [facility] 11/09/2017, 1:58 PM

## 2017-11-16 ENCOUNTER — Encounter: Payer: Self-pay | Admitting: Family Medicine

## 2017-11-16 ENCOUNTER — Encounter: Payer: Self-pay | Admitting: *Deleted

## 2017-11-16 ENCOUNTER — Ambulatory Visit: Payer: BLUE CROSS/BLUE SHIELD | Admitting: Family Medicine

## 2017-11-16 VITALS — BP 126/75 | HR 64 | Temp 96.9°F | Ht 65.0 in | Wt 122.0 lb

## 2017-11-16 DIAGNOSIS — J329 Chronic sinusitis, unspecified: Secondary | ICD-10-CM

## 2017-11-16 DIAGNOSIS — J40 Bronchitis, not specified as acute or chronic: Secondary | ICD-10-CM

## 2017-11-16 MED ORDER — AZITHROMYCIN 250 MG PO TABS: ORAL_TABLET | ORAL | 0 refills | Status: DC

## 2017-11-16 NOTE — Progress Notes (Signed)
Chief Complaint  Patient presents with  . Dizziness    lightheaded  . Diarrhea  . Nausea    HPI  Patient presents today for Patient presents with upper respiratory congestion. Rhinorrhea that is scant and clear. There is moderate sore throat. Patient reports coughing frequently as well.  No sputum noted. There is no fever, chills or sweats. The patient denies being short of breath. Onset was 3-5 days ago. Gradually worsening. Tried OTCs .not using Mucinex sinus max along with DayQuil.  Each every 4 hours.  Patient mentions one loose bowel movement this morning.  Otherwise stools have been normal.  When she was seen for the cold she was given over-the-counter recommendations but no prescription medication other than Flonase. PMH: Smoking status noted ROS: Per HPI  Objective: BP 126/75 (BP Location: Right Arm)   Pulse 64   Temp (!) 96.9 F (36.1 C) (Oral)   Ht 5\' 5"  (1.651 m)   Wt 122 lb (55.3 kg)   BMI 20.30 kg/m  Gen: NAD, alert, cooperative with exam HEENT: NCAT, Nasal passages swollen, red.  TMS clear CV: RRR, good S1/S2, no murmur Resp: Bronchitis changes with scattered wheezes, non-labored Ext: No edema, warm Neuro: Alert and oriented, No gross deficits  Assessment and plan:  1. Sinobronchitis     Meds ordered this encounter  Medications  . azithromycin (ZITHROMAX) 250 MG tablet    Sig: As directed    Dispense:  6 tablet    Refill:  0    No orders of the defined types were placed in this encounter.   Follow up as needed.  Claretta Fraise, MD

## 2017-11-24 ENCOUNTER — Encounter: Payer: Self-pay | Admitting: Family Medicine

## 2017-11-24 ENCOUNTER — Ambulatory Visit: Payer: BLUE CROSS/BLUE SHIELD | Admitting: Family Medicine

## 2017-11-24 VITALS — BP 108/60 | HR 67 | Temp 97.2°F | Ht 65.0 in | Wt 122.2 lb

## 2017-11-24 DIAGNOSIS — R3129 Other microscopic hematuria: Secondary | ICD-10-CM

## 2017-11-24 DIAGNOSIS — R399 Unspecified symptoms and signs involving the genitourinary system: Secondary | ICD-10-CM | POA: Diagnosis not present

## 2017-11-24 DIAGNOSIS — N390 Urinary tract infection, site not specified: Secondary | ICD-10-CM | POA: Diagnosis not present

## 2017-11-24 LAB — URINALYSIS, COMPLETE
BILIRUBIN UA: NEGATIVE
Glucose, UA: NEGATIVE
KETONES UA: NEGATIVE
Nitrite, UA: NEGATIVE
PH UA: 7 (ref 5.0–7.5)
PROTEIN UA: NEGATIVE
SPEC GRAV UA: 1.01 (ref 1.005–1.030)
Urobilinogen, Ur: 0.2 mg/dL (ref 0.2–1.0)

## 2017-11-24 LAB — MICROSCOPIC EXAMINATION
RBC, UA: >30 /hpf — AB (ref 0–2)
Renal Epithel, UA: NONE SEEN /hpf
WBC, UA: >30 /hpf — AB (ref 0–5)

## 2017-11-24 MED ORDER — NITROFURANTOIN MONOHYD MACRO 100 MG PO CAPS: 100.0000 mg | ORAL_CAPSULE | Freq: Two times a day (BID) | ORAL | 0 refills | Status: DC

## 2017-11-24 NOTE — Progress Notes (Signed)
Chief Complaint  Patient presents with  . Burning with Urination    HPI  Patient presents today for burning with urination and frequency for several days. Denies fever . No flank pain. No nausea, vomiting.   PMH: Smoking status noted ROS: Per HPI  Objective: BP 108/60   Pulse 67   Temp (!) 97.2 F (36.2 C) (Oral)   Ht 5\' 5"  (1.651 m)   Wt 122 lb 3.2 oz (55.4 kg)   BMI 20.34 kg/m  Gen: NAD, alert, cooperative with exam HEENT: NCAT, EOMI, PERRL CV: RRR, good S1/S2, no murmur Resp: CTABL, no wheezes, non-labored Abd: SNTND, BS present, no guarding or organomegaly Ext: No edema, warm Neuro: Alert and oriented, No gross deficits  Assessment and plan:  1. Recurrent UTI   2. UTI symptoms   3. Other microscopic hematuria     Meds ordered this encounter  Medications  . nitrofurantoin, macrocrystal-monohydrate, (MACROBID) 100 MG capsule    Sig: Take 1 capsule (100 mg total) by mouth 2 (two) times daily. 1 po BId    Dispense:  14 capsule    Refill:  0    Orders Placed This Encounter  Procedures  . Urine Culture    Standing Status:   Future    Standing Expiration Date:   12/24/2017  . Urine Culture    Standing Status:   Future    Standing Expiration Date:   12/24/2017  . Urinalysis, Complete  . Urinalysis, Complete    Standing Status:   Future    Standing Expiration Date:   12/24/2017   Repeat UA after tx due to hematuria, otherwise, Follow up as needed.  Claretta Fraise, MD

## 2017-11-24 NOTE — Addendum Note (Signed)
Addended by: Earlene Plater on: 11/24/2017 04:46 PM   Modules accepted: Orders

## 2017-11-26 LAB — URINE CULTURE

## 2017-12-02 ENCOUNTER — Other Ambulatory Visit: Payer: BLUE CROSS/BLUE SHIELD

## 2017-12-02 DIAGNOSIS — N390 Urinary tract infection, site not specified: Secondary | ICD-10-CM

## 2017-12-02 DIAGNOSIS — R399 Unspecified symptoms and signs involving the genitourinary system: Secondary | ICD-10-CM | POA: Diagnosis not present

## 2017-12-02 LAB — MICROSCOPIC EXAMINATION: Renal Epithel, UA: NONE SEEN /hpf

## 2017-12-02 LAB — URINALYSIS, COMPLETE
BILIRUBIN UA: NEGATIVE
GLUCOSE, UA: NEGATIVE
Ketones, UA: NEGATIVE
Nitrite, UA: NEGATIVE
PROTEIN UA: NEGATIVE
Specific Gravity, UA: 1.015 (ref 1.005–1.030)
UUROB: 0.2 mg/dL (ref 0.2–1.0)
pH, UA: 6.5 (ref 5.0–7.5)

## 2017-12-03 LAB — URINE CULTURE: Organism ID, Bacteria: NO GROWTH

## 2018-02-08 DIAGNOSIS — Z719 Counseling, unspecified: Secondary | ICD-10-CM | POA: Diagnosis not present

## 2018-02-08 DIAGNOSIS — Z008 Encounter for other general examination: Secondary | ICD-10-CM | POA: Diagnosis not present

## 2018-02-08 DIAGNOSIS — Z1389 Encounter for screening for other disorder: Secondary | ICD-10-CM | POA: Diagnosis not present

## 2018-02-08 DIAGNOSIS — Z1331 Encounter for screening for depression: Secondary | ICD-10-CM | POA: Diagnosis not present

## 2018-02-19 ENCOUNTER — Encounter: Payer: Self-pay | Admitting: Nurse Practitioner

## 2018-02-19 ENCOUNTER — Ambulatory Visit (INDEPENDENT_AMBULATORY_CARE_PROVIDER_SITE_OTHER): Payer: BLUE CROSS/BLUE SHIELD | Admitting: Nurse Practitioner

## 2018-02-19 ENCOUNTER — Ambulatory Visit (INDEPENDENT_AMBULATORY_CARE_PROVIDER_SITE_OTHER): Payer: BLUE CROSS/BLUE SHIELD

## 2018-02-19 VITALS — BP 117/65 | HR 58 | Temp 97.1°F | Ht 65.0 in | Wt 126.0 lb

## 2018-02-19 DIAGNOSIS — Z0001 Encounter for general adult medical examination with abnormal findings: Secondary | ICD-10-CM

## 2018-02-19 DIAGNOSIS — K219 Gastro-esophageal reflux disease without esophagitis: Secondary | ICD-10-CM

## 2018-02-19 DIAGNOSIS — F411 Generalized anxiety disorder: Secondary | ICD-10-CM

## 2018-02-19 DIAGNOSIS — Z23 Encounter for immunization: Secondary | ICD-10-CM | POA: Diagnosis not present

## 2018-02-19 DIAGNOSIS — Z1212 Encounter for screening for malignant neoplasm of rectum: Secondary | ICD-10-CM

## 2018-02-19 DIAGNOSIS — M8588 Other specified disorders of bone density and structure, other site: Secondary | ICD-10-CM

## 2018-02-19 DIAGNOSIS — Z0000 Encounter for general adult medical examination without abnormal findings: Secondary | ICD-10-CM

## 2018-02-19 DIAGNOSIS — M85852 Other specified disorders of bone density and structure, left thigh: Secondary | ICD-10-CM | POA: Diagnosis not present

## 2018-02-19 DIAGNOSIS — Z1211 Encounter for screening for malignant neoplasm of colon: Secondary | ICD-10-CM

## 2018-02-19 LAB — MICROSCOPIC EXAMINATION
Bacteria, UA: NONE SEEN
Renal Epithel, UA: NONE SEEN /hpf
WBC, UA: NONE SEEN /hpf (ref 0–5)

## 2018-02-19 LAB — URINALYSIS, COMPLETE
Bilirubin, UA: NEGATIVE
GLUCOSE, UA: NEGATIVE
Ketones, UA: NEGATIVE
Leukocytes, UA: NEGATIVE
NITRITE UA: NEGATIVE
PROTEIN UA: NEGATIVE
Specific Gravity, UA: 1.015 (ref 1.005–1.030)
Urobilinogen, Ur: 0.2 mg/dL (ref 0.2–1.0)
pH, UA: 6.5 (ref 5.0–7.5)

## 2018-02-19 NOTE — Progress Notes (Signed)
Subjective:    Patient ID: [name], female    DOB: September 20, 1950, 68 y.o.   MRN: [id number]   Chief Complaint: Annual Exam (No pap)   HPI:  1. Annual physical exam  ;ast pap was done 2/5/19and was normal  2. Gastroesophageal reflux disease without esophagitis  Is not on any meds currently. Only takes OTC meds as needed.  3. Osteopenia of lumbar spine  Last bone density test was 09/27/14 with t score of -1.8. she does no weight bearing exercise.  4. GAD (generalized anxiety disorder)  Is I=on no meds. She says she does not get anxious real often but when she does she just tries to relax.        New complaints: None today  Social history: [place] her [place] farm. He had a stroke last year and he is doing well.    Review of Systems  Constitutional: Negative for activity change and appetite change.  HENT: Negative.   Eyes: Negative for pain.  Respiratory: Negative for shortness of breath.   Cardiovascular: Negative for chest pain, palpitations and leg swelling.  Gastrointestinal: Negative for abdominal pain.  Endocrine: Negative for polydipsia.  Genitourinary: Negative.   Skin: Negative for rash.  Neurological: Negative for dizziness, weakness and headaches.  Hematological: Does not bruise/bleed easily.  Psychiatric/Behavioral: Negative.   All other systems reviewed and are negative.      Objective:   Physical Exam Vitals signs and nursing note reviewed.  Constitutional:      General: She is not in acute distress.    Appearance: Normal appearance. She is well-developed.  HENT:     Head: Normocephalic.     Nose: Nose normal.  Eyes:     Pupils: Pupils are equal, round, and reactive to light.  Neck:     Musculoskeletal: Normal range of motion and neck supple.     Vascular: No carotid bruit or JVD.  Cardiovascular:     Rate and Rhythm: Normal rate and regular rhythm.     Heart sounds: Normal heart sounds.  Pulmonary:     Effort: Pulmonary effort  is normal. No respiratory distress.     Breath sounds: Normal breath sounds. No wheezing or rales.  Chest:     Chest wall: No tenderness.  Abdominal:     General: Bowel sounds are normal. There is no distension or abdominal bruit.     Palpations: Abdomen is soft. There is no hepatomegaly, splenomegaly, mass or pulsatile mass.     Tenderness: There is no abdominal tenderness.  Musculoskeletal: Normal range of motion.  Lymphadenopathy:     Cervical: No cervical adenopathy.  Skin:    General: Skin is warm and dry.  Neurological:     Mental Status: She is alert and oriented to person, place, and time.     Deep Tendon Reflexes: Reflexes are normal and symmetric.  Psychiatric:        Behavior: Behavior normal.        Thought Content: Thought content normal.        Judgment: Judgment normal.    BP 117/65   Pulse (!) 58   Temp (!) 97.1 F (36.2 C) (Oral)   Ht 5\' 5"  (1.651 m)   Wt 126 lb (57.2 kg)   BMI 20.97 kg/m   Urine clear    Assessment & Plan:  [name] comes in today with chief complaint of Annual Exam (No pap)   Diagnosis and orders addressed:  1. Annual  physical exam Pap needs to be repeated in 2 years  2. Gastroesophageal reflux disease without esophagitis Avoid spicy foods Do not eat 2 hours prior to bedtime   3. Osteopenia of lumbar spine Weight bearing exercises Daily multivitamin with calcium and vitamin d  4. GAD (generalized anxiety disorder) Stress management  5. Encounter for colorectal cancer screening   Labs pending Health Maintenance reviewed Diet and exercise encouraged  Follow up plan: 1 year    [place], FNP

## 2018-02-19 NOTE — Patient Instructions (Signed)
            Cologuard  Your doctor has prescribed Cologuard, an easy-to-use, noninvasive test for colon cancer screening, based on the latest advances in stool DNA science.   Here's what will happen next:  1. You may receive a call or email from [facility] to confirm your mailing address and insurance information 2. Your kit will be shipped directly to you 3. You collect your stool sample in the privacy of your own home. Please follow the instructions that come with the kit 4. You return the kit via UPS prepaid shipping or pick-up, in the same box it arrived in 5. You should receive a call with the results once they are available. If you do not receive a call, please contact our office at [phone]  Insurance Coverage  Cologuard is covered by Medicare and most major insurers Cologuard is covered by Medicare and Medicare Advantage with no co-pay or deductible for eligible patients ages 50-85. [employer], more than 94% of Cologuard patients have no out-of-pocket cost for screening. . Based on the Affordable Care Act, Cologuard should be covered by most private insurers with no co-pay or deductible for eligible patients (ages 50-75; at average risk for colon cancer; without symptoms). Currently, ~74% of Cologuard patients 45-49 have had no out-of-pocket cost for screening.  . Many national and regional payers have begun paying for CRC screening at 45. [facility] continues to work with payers to expand coverage and access for patients ages 45-49.   Patient Support Exceptions for coverage may apply; only your insurer can confirm how Cologuard would be covered for you. Our team of specialists can help you contact your insurer and ask the right questions - please call us at [phone].  Screening for colon cancer is very important to your good health, so if you have any questions at all, please call [facility] Customer Support Specialists at [phone]. They are  available 24 hours a day, 6 days a week. Or, if you prefer, you can watch an instructional video at [email or web address]   *Information and graphics obtained from [email or web address]  

## 2018-02-19 NOTE — Addendum Note (Signed)
Addended by: Rolena Infante on: 02/19/2018 11:04 AM   Modules accepted: Orders

## 2018-02-19 NOTE — Addendum Note (Signed)
Addended by: Rolena Infante on: 02/19/2018 03:50 PM   Modules accepted: Orders

## 2018-02-20 LAB — CMP14+EGFR
A/G RATIO: 1.6 (ref 1.2–2.2)
ALK PHOS: 82 IU/L (ref 39–117)
ALT: 15 IU/L (ref 0–32)
AST: 19 IU/L (ref 0–40)
Albumin: 4.6 g/dL (ref 3.8–4.8)
BILIRUBIN TOTAL: 0.6 mg/dL (ref 0.0–1.2)
BUN/Creatinine Ratio: 15 (ref 12–28)
BUN: 13 mg/dL (ref 8–27)
CHLORIDE: 103 mmol/L (ref 96–106)
CO2: 22 mmol/L (ref 20–29)
Calcium: 9.7 mg/dL (ref 8.7–10.3)
Creatinine, Ser: 0.85 mg/dL (ref 0.57–1.00)
GFR calc non Af Amer: 72 mL/min/{1.73_m2} (ref >59)
GFR, EST AFRICAN AMERICAN: 83 mL/min/{1.73_m2} (ref >59)
GLUCOSE: 96 mg/dL (ref 65–99)
Globulin, Total: 2.8 g/dL (ref 1.5–4.5)
Potassium: 4.3 mmol/L (ref 3.5–5.2)
Sodium: 140 mmol/L (ref 134–144)
TOTAL PROTEIN: 7.4 g/dL (ref 6.0–8.5)

## 2018-02-20 LAB — CBC WITH DIFFERENTIAL/PLATELET
Basophils Absolute: 0 10*3/uL (ref 0.0–0.2)
Basos: 1 %
EOS (ABSOLUTE): 0.1 10*3/uL (ref 0.0–0.4)
Eos: 1 %
Hematocrit: 37 % (ref 34.0–46.6)
Hemoglobin: 12.6 g/dL (ref 11.1–15.9)
IMMATURE GRANS (ABS): 0 10*3/uL (ref 0.0–0.1)
Immature Granulocytes: 0 %
Lymphocytes Absolute: 1.6 10*3/uL (ref 0.7–3.1)
Lymphs: 27 %
MCH: 31.9 pg (ref 26.6–33.0)
MCHC: 34.1 g/dL (ref 31.5–35.7)
MCV: 94 fL (ref 79–97)
Monocytes Absolute: 0.4 10*3/uL (ref 0.1–0.9)
Monocytes: 7 %
Neutrophils Absolute: 3.7 10*3/uL (ref 1.4–7.0)
Neutrophils: 64 %
Platelets: 258 10*3/uL (ref 150–450)
RBC: 3.95 x10E6/uL (ref 3.77–5.28)
RDW: 11.2 % — ABNORMAL LOW (ref 11.7–15.4)
WBC: 5.8 10*3/uL (ref 3.4–10.8)

## 2018-02-20 LAB — THYROID PANEL WITH TSH
Free Thyroxine Index: 2.3 (ref 1.2–4.9)
T3 Uptake Ratio: 28 % (ref 24–39)
T4, Total: 8.2 ug/dL (ref 4.5–12.0)
TSH: 1.08 u[IU]/mL (ref 0.450–4.500)

## 2018-02-20 LAB — LIPID PANEL
Chol/HDL Ratio: 2.4 ratio (ref 0.0–4.4)
Cholesterol, Total: 201 mg/dL — ABNORMAL HIGH (ref 100–199)
HDL: 85 mg/dL (ref >39)
LDL Calculated: 98 mg/dL (ref 0–99)
TRIGLYCERIDES: 89 mg/dL (ref 0–149)
VLDL Cholesterol Cal: 18 mg/dL (ref 5–40)

## 2018-02-22 ENCOUNTER — Telehealth: Payer: Self-pay | Admitting: Nurse Practitioner

## 2018-02-22 MED ORDER — ALENDRONATE SODIUM 70 MG PO TABS: 70.0000 mg | ORAL_TABLET | ORAL | 11 refills | Status: DC

## 2018-02-22 NOTE — Telephone Encounter (Signed)
Fosamax sent to pharmacy for osteoporosis- discussed with patient at last appointment

## 2018-02-25 DIAGNOSIS — Z1211 Encounter for screening for malignant neoplasm of colon: Secondary | ICD-10-CM | POA: Diagnosis not present

## 2018-02-25 DIAGNOSIS — Z1212 Encounter for screening for malignant neoplasm of rectum: Secondary | ICD-10-CM | POA: Diagnosis not present

## 2018-03-02 LAB — COLOGUARD: Cologuard: NEGATIVE

## 2018-03-18 DIAGNOSIS — M549 Dorsalgia, unspecified: Secondary | ICD-10-CM | POA: Diagnosis not present

## 2018-03-18 DIAGNOSIS — K5900 Constipation, unspecified: Secondary | ICD-10-CM | POA: Diagnosis not present

## 2018-03-18 DIAGNOSIS — S39012A Strain of muscle, fascia and tendon of lower back, initial encounter: Secondary | ICD-10-CM | POA: Diagnosis not present

## 2018-04-03 ENCOUNTER — Telehealth: Payer: Self-pay | Admitting: Nurse Practitioner

## 2018-04-03 MED ORDER — FLUTICASONE PROPIONATE 50 MCG/ACT NA SUSP: 2.0000 | Freq: Every day | NASAL | 6 refills | Status: DC

## 2018-04-03 NOTE — Telephone Encounter (Signed)
Hey [name] this is [name]. It sounds like allergies. I will send in flonase nasal spray and you can take delsym or mucinex OTC.Marland Kitchen Patient aware

## 2018-04-03 NOTE — Telephone Encounter (Signed)
[facility]IC[facility]  SWITCHBOARD  SICK CALL SCREENING   04/03/2018  [name]    BTD:[id number]    DOB:November 15, 1950  Best Contact Telephone Number: [phone]   2. Symptoms: Throat Scratchy, headache, eyes watering, feels like head is full, doesn't believe fever (no way of checking temp), chills yesterday  2. Symptom Onset: 04/02/2018  3. Have you traveled any over the past 14 days? NO  4.   Have you been in recent contact with someone that has tested positive for COVID-19? NO  5.   Which pharmacy would you use today if given a prescription? [place] was informed that this information will be given to a clinical staff member to review and that they should receive a follow-up telephone call within 24 hours. she was advised to call back if she develops any new symptoms or if her current symptoms worsen.   Screened by: Sandra Cockayne

## 2018-04-05 ENCOUNTER — Telehealth (INDEPENDENT_AMBULATORY_CARE_PROVIDER_SITE_OTHER): Payer: BLUE CROSS/BLUE SHIELD | Admitting: Nurse Practitioner

## 2018-04-05 ENCOUNTER — Other Ambulatory Visit: Payer: Self-pay

## 2018-04-05 ENCOUNTER — Telehealth: Payer: Self-pay | Admitting: Nurse Practitioner

## 2018-04-05 DIAGNOSIS — J029 Acute pharyngitis, unspecified: Secondary | ICD-10-CM | POA: Diagnosis not present

## 2018-04-05 DIAGNOSIS — H8303 Labyrinthitis, bilateral: Secondary | ICD-10-CM

## 2018-04-05 MED ORDER — MECLIZINE HCL 25 MG PO TABS: 25.0000 mg | ORAL_TABLET | Freq: Three times a day (TID) | ORAL | 0 refills | Status: DC | PRN

## 2018-04-05 MED ORDER — AMOXICILLIN-POT CLAVULANATE 875-125 MG PO TABS: 1.0000 | ORAL_TABLET | Freq: Two times a day (BID) | ORAL | 0 refills | Status: DC

## 2018-04-05 NOTE — Telephone Encounter (Signed)
Pt had tel visit and meds that were sent [facility] has not received. Please resend or call in.

## 2018-04-05 NOTE — Telephone Encounter (Signed)
[facility] MEDICINE  SWITCHBOARD  SICK CALL SCREENING   04/05/2018  [name]    SMO:[id number]    DOB:1950-02-03  Best Contact Telephone Number: [phone]   9. Symptoms: Sore throat, nausea, vomiting, headache  Symptom Onset: 3 days 2. Have you traveled any over the past 14 days? No  4.   Have you been in recent contact with someone that has tested positive for COVID-19? No  5.   Which pharmacy would you use today if given a prescription? [place] was informed that this information will be given to a clinical staff member to review and that they should receive a follow-up telephone call within 24 hours. she was advised to call back if she develops any new symptoms or if her current symptoms worsen.   Screened by: Truett Mainland, LPN

## 2018-04-05 NOTE — Patient Instructions (Signed)
Force fluids °Motrin or tylenol OTC °OTC decongestant °Throat lozenges if help °New toothbrush in 3 days ° °

## 2018-04-05 NOTE — Progress Notes (Signed)
   Virtual Visit via telephone Note  I connected with [name] on 04/05/18 at 958 by telephone and verified that I am speaking with the correct person using two identifiers. [name] is currently located at home and husband are currently with her during visit. The provider, Mary-Margaret Hassell Done, FNP is located in their office at time of visit.  I discussed the limitations, risks, security and privacy concerns of performing an evaluation and management service by telephone and the availability of in person appointments. I also discussed with the patient that there may be a patient responsible charge related to this service. The patient expressed understanding and agreed to proceed.   History and Present Illness:   Chief Complaint: sore throat and headache.  HPI Patient calls in today c/o sore throat  And headache. Says that she feels light headed when she stands up and feels like she is going to throw up. Started this [past Saturday. Feels worse today. Says he feels warm but no fever.   Outpatient Encounter Medications as of 04/05/2018  Medication Sig  . alendronate (FOSAMAX) 70 MG tablet Take 1 tablet (70 mg total) by mouth every 7 (seven) days. Take with a full glass of water on an empty stomach.  . fluticasone (FLONASE) 50 MCG/ACT nasal spray Place 2 sprays into both nostrils daily.    Review of Systems  Constitutional: Negative for chills and fever.  HENT: Positive for sore throat. Negative for congestion.   Eyes: Negative.   Respiratory: Negative.   Cardiovascular: Negative.   Gastrointestinal: Positive for nausea (when standing).  Genitourinary: Negative.   Musculoskeletal: Negative.   Skin: Negative for rash.  Neurological: Positive for dizziness.  Psychiatric/Behavioral: Negative.   All other systems reviewed and are negative.   Observations/Objective: Alert and oriented- answers all questions appropriately  Assessment and Plan: [name] comes in today  with chief complaint of No chief complaint on file.   Diagnosis and orders addressed:  1. Labyrinthitis of both ears Force fluids rest - meclizine (ANTIVERT) 25 MG tablet; Take 1 tablet (25 mg total) by mouth 3 (three) times daily as needed for dizziness.  Dispense: 30 tablet; Refill: 0  2. Acute pharyngitis, unspecified etiology Force fluids Motrin or tylenol OTC OTC decongestant Throat lozenges if help New toothbrush in 3 days  - amoxicillin-clavulanate (AUGMENTIN) 875-125 MG tablet; Take 1 tablet by mouth 2 (two) times daily.  Dispense: 14 tablet; Refill: 0   Follow up plan: prn   Mary-Margaret Hassell Done, FNP   Follow Up Instructions:     I discussed the assessment and treatment plan with the patient. The patient was provided an opportunity to ask questions and all were answered. The patient agreed with the plan and demonstrated an understanding of the instructions.   The patient was advised to call back or seek an in-person evaluation if the symptoms worsen or if the condition fails to improve as anticipated.  The above assessment and management plan was discussed with the patient. The patient verbalized understanding of and has agreed to the management plan. Patient is aware to call the clinic if symptoms persist or worsen. Patient is aware when to return to the clinic for a follow-up visit. Patient educated on when it is appropriate to go to the emergency department.    I provided 10 minutes of non-face-to-face time during this encounter.    Mary-Margaret Hassell Done, FNP

## 2018-04-27 ENCOUNTER — Encounter: Payer: Self-pay | Admitting: Family

## 2018-04-27 ENCOUNTER — Other Ambulatory Visit: Payer: Self-pay

## 2018-04-27 ENCOUNTER — Ambulatory Visit: Payer: BLUE CROSS/BLUE SHIELD | Admitting: Family

## 2018-04-27 VITALS — BP 102/61 | HR 66 | Temp 98.1°F | Ht 65.0 in | Wt 124.6 lb

## 2018-04-27 DIAGNOSIS — S81812A Laceration without foreign body, left lower leg, initial encounter: Secondary | ICD-10-CM

## 2018-04-27 MED ORDER — CEPHALEXIN 500 MG PO CAPS: 500.0000 mg | ORAL_CAPSULE | Freq: Three times a day (TID) | ORAL | 0 refills | Status: DC

## 2018-04-27 NOTE — Patient Instructions (Signed)
Laceration Care, Adult  A laceration is a cut that may go through all layers of the skin and into the tissue that is right under the skin. Some lacerations heal on their own. Others need to be closed with stitches (sutures), staples, skin adhesive strips, or skin glue. Proper care of a laceration reduces the risk for infection, helps the laceration heal better, and may prevent scarring.  How to care for your laceration  Wash your hands with soap and water before touching your wound or changing your bandage (dressing). If soap and water are not available, use hand sanitizer.  Keep the wound clean and dry.  If you were given a dressing, you should change it at least once a day, or as told by your health care provider. You should also change it if it becomes wet or dirty.  If sutures or staples were used:  · Keep the wound completely dry for the first 24 hours, or as told by your health care provider. After that time, you may shower or bathe. However, make sure that the wound is not soaked in water until after the sutures or staples have been removed.  · Clean the wound once each day, or as told by your health care provider:  ? Wash the wound with soap and water.  ? Rinse the wound with water to remove all soap.  ? Pat the wound dry with a clean towel. Do not rub the wound.  · After cleaning the wound, apply a thin layer of antibiotic ointment as told by your health care provider. This will help prevent infection and keep the dressing from sticking to the wound.  · Have the sutures or staples removed as told by your health care provider.  If skin adhesive strips were used:  · Do not get the skin adhesive strips wet. You may shower or bathe, but be careful to keep the wound dry.  · If the wound gets wet, pat it dry with a clean towel. Do not rub the wound.  · Skin adhesive strips fall off on their own. You may trim the strips as the wound heals. Do not remove skin adhesive strips that are still stuck to the wound. They  will fall off in time.  If skin glue was used:  · Try to keep the wound dry, but you may briefly wet it in the shower or bath. Do not soak the wound in water, such as by swimming.  · After you have showered or bathed, gently pat the wound dry with a clean towel. Do not rub the wound.  · Do not do any activities that will make you sweat heavily until the skin glue has fallen off on its own.  · Do not apply liquid, cream, or ointment medicine to the wound while the skin glue is in place. Using those may loosen the film before the wound has healed.  · If a dressing is placed over the wound, be careful not to apply tape directly over the skin glue. Doing that may cause the glue to be pulled off before the wound has healed.  · Do not pick at the glue. Skin glue usually remains in place for 5-10 days and then falls off the skin.  General instructions    · Take over-the-counter and prescription medicines only as told by your health care provider.  · If you were prescribed an antibiotic medicine or ointment, take or apply it as told by your health care provider.   Do not stop using it even if your condition improves.  · Do not scratch or pick at the wound.  · Check your wound every day for signs of infection. Watch for:  ? Redness, swelling, or pain.  ? Fluid, blood, or pus.  · Raise (elevate) the injured area above the level of your heart while you are sitting or lying down for the first 24-48 hours after the laceration is repaired.  · If directed, put ice on the affected area:  ? Put ice in a plastic bag.  ? Place a towel between your skin and the bag.  ? Leave the ice on for 20 minutes, 2-3 times a day.  · Keep all follow-up visits as told by your health care provider. This is important.  Contact a health care provider if:  · You received a tetanus shot and you have swelling, severe pain, redness, or bleeding at the injection site.  · You have a fever.  · A wound that was closed breaks open.  · You notice a bad smell  coming from your wound or your dressing.  · You notice something coming out of the wound, such as wood or glass.  · Your pain is not controlled with medicine.  · You have increased redness, swelling, or pain at the site of your wound.  · You have fluid, blood, or pus coming from your wound.  · You need to change the dressing often due to fluid, blood, or pus that is draining from the wound.  · You develop a new rash.  · You develop numbness around the wound.  Get help right away if:  · You develop severe swelling around the wound.  · Your pain suddenly increases and is severe.  · You develop painful lumps near the wound or on skin anywhere else on your body.  · You have a red streak going away from your wound.  · The wound is on your hand or foot and you cannot properly move a finger or toe.  · The wound is on your hand or foot, and you notice that your fingers or toes look pale or bluish.  Summary  · A laceration is a cut that may go through all layers of the skin and into the tissue that is right under the skin.  · Some lacerations heal on their own. Others need to be closed with stitches (sutures), staples, skin adhesive strips, or skin glue.  · Proper care of a laceration reduces the risk of infection, helps the laceration heal better, and prevents scarring.  This information is not intended to replace advice given to you by your health care provider. Make sure you discuss any questions you have with your health care provider.  Document Released: 12/30/2004 Document Revised: 01/19/2017 Document Reviewed: 01/19/2017  Elsevier Interactive Patient Education © 2019 [employer]

## 2018-04-27 NOTE — Progress Notes (Signed)
   Subjective:    Patient ID: [name], female    DOB: [birth year], 68 y.o.   MRN: [id number]  Chief Complaint  Patient presents with  . laceration lower left leg    Laceration   The incident occurred 3 to 6 hours ago. Pain location: left lower leg. The laceration is 5 cm in size. Injury mechanism: hit it against bricks. The pain is at a severity of 5/10. The pain is mild. The pain has been intermittent since onset. She reports no foreign bodies present. Her tetanus status is UTD.      Review of Systems  Skin: Positive for wound.  All other systems reviewed and are negative.      Objective:   Physical Exam Vitals signs reviewed.  Constitutional:      General: She is not in acute distress.    Appearance: She is well-developed.  Neck:     Musculoskeletal: Normal range of motion and neck supple.     Thyroid: No thyromegaly.  Cardiovascular:     Rate and Rhythm: Normal rate and regular rhythm.     Heart sounds: Normal heart sounds. No murmur.  Pulmonary:     Effort: Pulmonary effort is normal. No respiratory distress.     Breath sounds: Normal breath sounds. No wheezing.  Abdominal:     General: Bowel sounds are normal. There is no distension.     Palpations: Abdomen is soft.     Tenderness: There is no abdominal tenderness.  Musculoskeletal: Normal range of motion.        General: No tenderness.  Skin:    General: Skin is warm and dry.     Findings: Laceration present.          Comments: 5CmX1cm laceration on left lower leg  Neurological:     Mental Status: She is alert and oriented to person, place, and time.     Cranial Nerves: No cranial nerve deficit.     Deep Tendon Reflexes: Reflexes are normal and symmetric.  Psychiatric:        Behavior: Behavior normal.        Thought Content: Thought content normal.        Judgment: Judgment normal.     Verbal consent given by patient. Local anesthesia Lidocaine 2% with 43ml Betadine prep 3-0 suture-5 simple  interrupted stitches Cleaned with Saline Antibiotic ointment Dressing applied  BP 102/61   Pulse 66   Temp 98.1 F (36.7 C) (Oral)   Ht 5\' 5"  (1.651 m)   Wt 124 lb 9.6 oz (56.5 kg)   BMI 20.73 kg/m       Assessment & Plan:  [name] comes in today with chief complaint of laceration lower left leg   Diagnosis and orders addressed:  1. Laceration of left lower extremity, initial encounter Keep clean and dry Start Keflex  RTO in 7-10 days to have sutures removed Report any redness, discharge, increased pain, or fevers - cephALEXin (KEFLEX) 500 MG capsule; Take 1 capsule (500 mg total) by mouth 3 (three) times daily.  Dispense: 21 capsule; Refill: 0   Evelina Dun, FNP

## 2018-05-05 ENCOUNTER — Other Ambulatory Visit: Payer: Self-pay

## 2018-05-06 ENCOUNTER — Ambulatory Visit (INDEPENDENT_AMBULATORY_CARE_PROVIDER_SITE_OTHER): Payer: BLUE CROSS/BLUE SHIELD | Admitting: Family

## 2018-05-06 ENCOUNTER — Other Ambulatory Visit: Payer: Self-pay

## 2018-05-06 ENCOUNTER — Encounter: Payer: Self-pay | Admitting: Family

## 2018-05-06 VITALS — BP 112/51 | HR 66 | Temp 97.0°F | Ht 65.0 in | Wt 126.0 lb

## 2018-05-06 DIAGNOSIS — Z4802 Encounter for removal of sutures: Secondary | ICD-10-CM | POA: Diagnosis not present

## 2018-05-06 NOTE — Patient Instructions (Signed)
Suture Removal, Care After  This sheet gives you information about how to care for yourself after your procedure. Your health care provider may also give you more specific instructions. If you have problems or questions, contact your health care provider.  What can I expect after the procedure?  After your stitches (sutures) are removed, it is common to have:  · Some discomfort and swelling in the area.  · Slight redness in the area.  Follow these instructions at home:  If you have a bandage:  · Wash your hands with soap and water before you change your bandage (dressing). If soap and water are not available, use hand sanitizer.  · Change your dressing as told by your health care provider. If your dressing becomes wet or dirty, or develops a bad smell, change it as soon as possible.  · If your dressing sticks to your skin, soak it in warm water to loosen it.  Wound care    · Check your wound every day for signs of infection. Check for:  ? More redness, swelling, or pain.  ? Fluid or blood.  ? Warmth.  ? Pus or a bad smell.  · Wash your hands with soap and water before and after touching your wound.  · Apply cream or ointment only as directed by your health care provider. If you are using cream or ointment, wash the area with soap and water 2 times a day to remove all the cream or ointment. Rinse off the soap and pat the area dry with a clean towel.  · If you have skin glue or adhesive strips on your wound, leave these closures in place. They may need to stay in place for 2 weeks or longer. If adhesive strip edges start to loosen and curl up, you may trim the loose edges. Do not remove adhesive strips completely unless your health care provider tells you to do that.  · Keep the wound area dry and clean. Do not take baths, swim, or use a hot tub until your health care provider approves.  · Continue to protect the wound from injury.  · Do not pick at your wound. Picking can cause an infection.  · When your wound has  completely healed, wear sunscreen over it or cover it with clothing when you are outside. New scars get sunburned easily, which can make scarring worse.  General instructions  · Take over-the-counter and prescription medicines only as told by your health care provider.  · Keep all follow-up visits as told by your health care provider. This is important.  Contact a health care provider if:  · You have redness, swelling, or pain around your wound.  · You have fluid or blood coming from your wound.  · Your wound feels warm to the touch.  · You have pus or a bad smell coming from your wound.  · Your wound opens up.  Get help right away if:  · You have a fever.  · You have redness that is spreading from your wound.  Summary  · After your sutures are removed, it is common to have some discomfort and swelling in the area.  · Wash your hands with soap and water before you change your bandage (dressing).  · Keep the wound area dry and clean. Do not take baths, swim, or use a hot tub until your health care provider approves.  This information is not intended to replace advice given to you by your health care   provider. Make sure you discuss any questions you have with your health care provider.  Document Released: 09/24/2000 Document Revised: 02/05/2016 Document Reviewed: 02/05/2016  Elsevier Interactive Patient Education © 2019 [employer] Inc[employer]

## 2018-05-06 NOTE — Progress Notes (Signed)
   Subjective:    Patient ID: [name], female    DOB: [birth year], 68 y.o.   MRN: [id number]   Chief Complaint  Patient presents with  . Suture / Staple Removal    Pt presents to the office today for removal of sutures.  Wound Check  She was originally treated 5 to 10 days ago (9 days ago). Previous treatment included laceration repair. Her temperature was unmeasured prior to arrival. There has been no drainage from the wound. Redness Status: mild redness. The swelling has improved. Pain course: mild pain. She has no difficulty moving the affected extremity or digit.      Review of Systems  Skin: Positive for wound.  All other systems reviewed and are negative.      Objective:   Physical Exam Vitals signs reviewed.  Constitutional:      General: She is not in acute distress.    Appearance: Normal appearance. She is well-developed.  Neck:     Musculoskeletal: Normal range of motion and neck supple.     Thyroid: No thyromegaly.  Cardiovascular:     Rate and Rhythm: Normal rate and regular rhythm.     Heart sounds: Normal heart sounds. No murmur.  Pulmonary:     Effort: Pulmonary effort is normal. No respiratory distress.     Breath sounds: Normal breath sounds. No wheezing.  Abdominal:     General: Bowel sounds are normal. There is no distension.     Palpations: Abdomen is soft.     Tenderness: There is no abdominal tenderness.  Musculoskeletal: Normal range of motion.        General: No tenderness.  Skin:    General: Skin is warm and dry.     Comments: Wound on left lower leg.   Neurological:     Mental Status: She is alert and oriented to person, place, and time.     Cranial Nerves: No cranial nerve deficit.     Deep Tendon Reflexes: Reflexes are normal and symmetric.  Psychiatric:        Behavior: Behavior normal.        Thought Content: Thought content normal.        Judgment: Judgment normal.    Area cleaned and  Sutures removed. Pt tolerated well. Mld  redness.    BP (!) 112/51   Pulse 66   Temp (!) 97 F (36.1 C) (Oral)   Ht 5\' 5"  (1.651 m)   Wt 126 lb (57.2 kg)   BMI 20.97 kg/m      Assessment & Plan:  [name] comes in today with chief complaint of Suture / Staple Removal   Diagnosis and orders addressed:  1. Visit for suture removal Keep area clean and dry Report any increase redness or discharge Call if symptoms worsen or do not improve    Evelina Dun, FNP

## 2018-07-06 DIAGNOSIS — L821 Other seborrheic keratosis: Secondary | ICD-10-CM | POA: Diagnosis not present

## 2018-07-06 DIAGNOSIS — L814 Other melanin hyperpigmentation: Secondary | ICD-10-CM | POA: Diagnosis not present

## 2018-07-06 DIAGNOSIS — D2272 Melanocytic nevi of left lower limb, including hip: Secondary | ICD-10-CM | POA: Diagnosis not present

## 2018-07-06 DIAGNOSIS — D692 Other nonthrombocytopenic purpura: Secondary | ICD-10-CM | POA: Diagnosis not present

## 2018-07-06 DIAGNOSIS — L570 Actinic keratosis: Secondary | ICD-10-CM | POA: Diagnosis not present

## 2018-08-13 DIAGNOSIS — Z1231 Encounter for screening mammogram for malignant neoplasm of breast: Secondary | ICD-10-CM | POA: Diagnosis not present

## 2018-10-27 DIAGNOSIS — Z23 Encounter for immunization: Secondary | ICD-10-CM | POA: Diagnosis not present

## 2019-01-19 DIAGNOSIS — R69 Illness, unspecified: Secondary | ICD-10-CM | POA: Diagnosis not present

## 2019-03-24 DIAGNOSIS — M65341 Trigger finger, right ring finger: Secondary | ICD-10-CM | POA: Insufficient documentation

## 2019-07-20 DIAGNOSIS — L570 Actinic keratosis: Secondary | ICD-10-CM | POA: Diagnosis not present

## 2019-07-20 DIAGNOSIS — L821 Other seborrheic keratosis: Secondary | ICD-10-CM | POA: Diagnosis not present

## 2019-07-20 DIAGNOSIS — H61001 Unspecified perichondritis of right external ear: Secondary | ICD-10-CM | POA: Diagnosis not present

## 2019-08-30 ENCOUNTER — Other Ambulatory Visit (HOSPITAL_COMMUNITY)
Admission: RE | Admit: 2019-08-30 | Discharge: 2019-08-30 | Disposition: A | Discharge status: Home / Self Care | Payer: Medicare HMO | Source: Ambulatory Visit | Attending: Nurse Practitioner | Admitting: Nurse Practitioner

## 2019-08-30 ENCOUNTER — Other Ambulatory Visit: Payer: Self-pay

## 2019-08-30 ENCOUNTER — Ambulatory Visit (INDEPENDENT_AMBULATORY_CARE_PROVIDER_SITE_OTHER): Payer: Medicare HMO | Admitting: Nurse Practitioner

## 2019-08-30 ENCOUNTER — Ambulatory Visit (INDEPENDENT_AMBULATORY_CARE_PROVIDER_SITE_OTHER): Payer: Medicare HMO

## 2019-08-30 ENCOUNTER — Encounter: Payer: Self-pay | Admitting: Nurse Practitioner

## 2019-08-30 VITALS — BP 121/59 | HR 58 | Temp 97.0°F | Resp 20 | Ht 65.0 in | Wt 129.0 lb

## 2019-08-30 DIAGNOSIS — Z23 Encounter for immunization: Secondary | ICD-10-CM | POA: Diagnosis not present

## 2019-08-30 DIAGNOSIS — Z0000 Encounter for general adult medical examination without abnormal findings: Secondary | ICD-10-CM | POA: Diagnosis not present

## 2019-08-30 DIAGNOSIS — Z0001 Encounter for general adult medical examination with abnormal findings: Secondary | ICD-10-CM

## 2019-08-30 DIAGNOSIS — F411 Generalized anxiety disorder: Secondary | ICD-10-CM

## 2019-08-30 DIAGNOSIS — M8588 Other specified disorders of bone density and structure, other site: Secondary | ICD-10-CM | POA: Diagnosis not present

## 2019-08-30 DIAGNOSIS — K219 Gastro-esophageal reflux disease without esophagitis: Secondary | ICD-10-CM

## 2019-08-30 DIAGNOSIS — J984 Other disorders of lung: Secondary | ICD-10-CM | POA: Diagnosis not present

## 2019-08-30 DIAGNOSIS — R69 Illness, unspecified: Secondary | ICD-10-CM | POA: Diagnosis not present

## 2019-08-30 DIAGNOSIS — Z136 Encounter for screening for cardiovascular disorders: Secondary | ICD-10-CM | POA: Diagnosis not present

## 2019-08-30 NOTE — Progress Notes (Addendum)
Subjective:    Patient ID: [name], female    DOB: 1950-07-30, 69 y.o.   MRN: [id number]   Chief Complaint: Medical Management of Chronic Issues    HPI:  1. Annual physical exam Will do pap today.  2. Gastroesophageal reflux disease without esophagitis Only takes OTC meds as  needed  3. GAD (generalized anxiety disorder) Is doing well. Is on no meds   4. Osteopenia of lumbar spine Last dexascan was done 02/19/18. t score -2.1. she does try to walk several times a week.    Outpatient Encounter Medications as of 08/30/2019  Medication Sig  . alendronate (FOSAMAX) 70 MG tablet Take 1 tablet (70 mg total) by mouth every 7 (seven) days. Take with a full glass of water on an empty stomach.  . fluticasone (FLONASE) 50 MCG/ACT nasal spray Place 2 sprays into both nostrils daily.  . meclizine (ANTIVERT) 25 MG tablet Take 1 tablet (25 mg total) by mouth 3 (three) times daily as needed for dizziness. (Patient not taking: Reported on 05/06/2018)     Past Surgical History:  Procedure Laterality Date  . APPENDECTOMY  03-09-2008  . CHOLECYSTECTOMY  1990  . CHOLECYSTECTOMY    . CYSTOSCOPY W/ RETROGRADES Bilateral 01/24/2013   Procedure: CYSTOSCOPY WITH RETROGRADE PYELOGRAM;  Surgeon: Molli Hazard, MD;  Location: [facility];  Service: Urology;  Laterality: Bilateral;  . CYSTOSCOPY WITH BIOPSY N/A 01/24/2013   Procedure: CYSTOSCOPY WITH BIOPSY;  Surgeon: Molli Hazard, MD;  Location: [facility];  Service: Urology;  Laterality: N/A;  . LAPAROSCOPIC APPENDECTOMY N/A 2011  . OPEN HIATAL HERNIA REPAIR  1985  . TUBAL LIGATION    . TUBAL LIGATION Bilateral     Family History  Problem Relation Age of Onset  . COPD Mother   . Cancer Father     New complaints: None today  Social history: [place]- she has recently retired from work.  Controlled substance contract: n/a    Review of Systems  Constitutional:  Negative for diaphoresis.  Eyes: Negative for pain.  Respiratory: Negative for shortness of breath.   Cardiovascular: Negative for chest pain, palpitations and leg swelling.  Gastrointestinal: Negative for abdominal pain.  Endocrine: Negative for polydipsia.  Skin: Negative for rash.  Neurological: Negative for dizziness, weakness and headaches.  Hematological: Does not bruise/bleed easily.  All other systems reviewed and are negative.      Objective:   Physical Exam Vitals and nursing note reviewed.  Constitutional:      General: She is not in acute distress.    Appearance: Normal appearance. She is well-developed.  HENT:     Head: Normocephalic.     Nose: Nose normal.  Eyes:     Pupils: Pupils are equal, round, and reactive to light.  Neck:     Vascular: No carotid bruit or JVD.  Cardiovascular:     Rate and Rhythm: Normal rate and regular rhythm.     Heart sounds: Normal heart sounds.  Pulmonary:     Effort: Pulmonary effort is normal. No respiratory distress.     Breath sounds: Normal breath sounds. No wheezing or rales.  Chest:     Chest wall: No tenderness.     Breasts:        Right: Normal.        Left: Normal.  Abdominal:     General: Bowel sounds are normal. There is no distension or abdominal bruit.  Palpations: Abdomen is soft. There is no hepatomegaly, splenomegaly, mass or pulsatile mass.     Tenderness: There is no abdominal tenderness.  Genitourinary:    General: Normal vulva.     Vagina: No vaginal discharge.     Rectum: Normal.     Comments: Cervix parous and pink No adnexal masses ot tenderness Large cystocele Musculoskeletal:        General: Normal range of motion.     Cervical back: Normal range of motion and neck supple.  Lymphadenopathy:     Cervical: No cervical adenopathy.  Skin:    General: Skin is warm and dry.  Neurological:     Mental Status: She is alert and oriented to person, place, and time.     Deep Tendon Reflexes: Reflexes  are normal and symmetric.  Psychiatric:        Behavior: Behavior normal.        Thought Content: Thought content normal.        Judgment: Judgment normal.   BP (!) 121/59   Pulse (!) 58   Temp (!) 97 F (36.1 C) (Temporal)   Resp 20   Ht 5' 5"  (1.651 m)   Wt 129 lb (58.5 kg)   SpO2 100%   BMI 21.47 kg/m   Chest xray- no acute or chronic cardiopulmonary abnormalities-Preliminary reading by Ronnald Collum, FNP  [facility]  EKG- sinus bradycardia- Mary-Margaret Hassell Done, FNP        Assessment & Plan:  SENAIDA CHILCOTE comes in today with chief complaint of Medical Management of Chronic Issues   Diagnosis and orders addressed:  1. Annual physical exam - DG Chest 2 View - EKG 12-Lead - CBC with Differential/Platelet - CMP14+EGFR - Lipid panel - Thyroid Panel With TSH - Cytology - PAP  2. Gastroesophageal reflux disease without esophagitis .Avoid spicy foods Do not eat 2 hours prior to bedtime   3. GAD (generalized anxiety disorder) Stress management  4. Osteopenia of lumbar spine Weight bearing exercise encouraged   Labs pending Health Maintenance reviewed- patient will schedule mammgram  Diet and exercise encouraged  Follow up plan: prn   Mary-Margaret Hassell Done, FNP

## 2019-08-30 NOTE — Patient Instructions (Signed)
Exercising to Stay Healthy To become healthy and stay healthy, it is recommended that you do moderate-intensity and vigorous-intensity exercise. You can tell that you are exercising at a moderate intensity if your heart starts beating faster and you start breathing faster but can still hold a conversation. You can tell that you are exercising at a vigorous intensity if you are breathing much harder and faster and cannot hold a conversation while exercising. Exercising regularly is important. It has many health benefits, such as:  Improving overall fitness, flexibility, and endurance.  Increasing bone density.  Helping with weight control.  Decreasing body fat.  Increasing muscle strength.  Reducing stress and tension.  Improving overall health. How often should I exercise? Choose an activity that you enjoy, and set realistic goals. Your health care provider can help you make an activity plan that works for you. Exercise regularly as told by your health care provider. This may include:  Doing strength training two times a week, such as: ? Lifting weights. ? Using resistance bands. ? Push-ups. ? Sit-ups. ? Yoga.  Doing a certain intensity of exercise for a given amount of time. Choose from these options: ? A total of 150 minutes of moderate-intensity exercise every week. ? A total of 75 minutes of vigorous-intensity exercise every week. ? A mix of moderate-intensity and vigorous-intensity exercise every week. Children, pregnant women, people who have not exercised regularly, people who are overweight, and older adults may need to talk with a health care provider about what activities are safe to do. If you have a medical condition, be sure to talk with your health care provider before you start a new exercise program. What are some exercise ideas? Moderate-intensity exercise ideas include:  Walking 1 mile (1.6 km) in about 15  minutes.  Biking.  Hiking.  Golfing.  Dancing.  Water aerobics. Vigorous-intensity exercise ideas include:  Walking 4.5 miles (7.2 km) or more in about 1 hour.  Jogging or running 5 miles (8 km) in about 1 hour.  Biking 10 miles (16.1 km) or more in about 1 hour.  Lap swimming.  Roller-skating or in-line skating.  Cross-country skiing.  Vigorous competitive sports, such as football, basketball, and soccer.  Jumping rope.  Aerobic dancing. What are some everyday activities that can help me to get exercise?  Yard work, such as: ? Pushing a lawn mower. ? Raking and bagging leaves.  Washing your car.  Pushing a stroller.  Shoveling snow.  Gardening.  Washing windows or floors. How can I be more active in my day-to-day activities?  Use stairs instead of an elevator.  Take a walk during your lunch break.  If you drive, park your car farther away from your work or school.  If you take public transportation, get off one stop early and walk the rest of the way.  Stand up or walk around during all of your indoor phone calls.  Get up, stretch, and walk around every 30 minutes throughout the day.  Enjoy exercise with a friend. Support to continue exercising will help you keep a regular routine of activity. What guidelines can I follow while exercising?  Before you start a new exercise program, talk with your health care provider.  Do not exercise so much that you hurt yourself, feel dizzy, or get very short of breath.  Wear comfortable clothes and wear shoes with good support.  Drink plenty of water while you exercise to prevent dehydration or heat stroke.  Work out until your breathing   and your heartbeat get faster. Where to find more information  [place] Department of Health and Human Services: www.hhs.gov  Centers for Disease Control and Prevention (CDC): www.cdc.gov Summary  Exercising regularly is important. It will improve your overall fitness,  flexibility, and endurance.  Regular exercise also will improve your overall health. It can help you control your weight, reduce stress, and improve your bone density.  Do not exercise so much that you hurt yourself, feel dizzy, or get very short of breath.  Before you start a new exercise program, talk with your health care provider. This information is not intended to replace advice given to you by your health care provider. Make sure you discuss any questions you have with your health care provider. Document Revised: 12/12/2016 Document Reviewed: 11/20/2016 Elsevier Patient Education  2020 Elsevier Inc.  

## 2019-08-30 NOTE — Addendum Note (Signed)
Addended by: Rolena Infante on: 08/30/2019 11:15 AM   Modules accepted: Orders

## 2019-08-31 LAB — CBC WITH DIFFERENTIAL/PLATELET
Basophils Absolute: 0 10*3/uL (ref 0.0–0.2)
Basos: 1 %
EOS (ABSOLUTE): 0.1 10*3/uL (ref 0.0–0.4)
Eos: 1 %
Hematocrit: 35.4 % (ref 34.0–46.6)
Hemoglobin: 12.3 g/dL (ref 11.1–15.9)
Immature Grans (Abs): 0 10*3/uL (ref 0.0–0.1)
Immature Granulocytes: 0 %
Lymphocytes Absolute: 1.4 10*3/uL (ref 0.7–3.1)
Lymphs: 27 %
MCH: 32.9 pg (ref 26.6–33.0)
MCHC: 34.7 g/dL (ref 31.5–35.7)
MCV: 95 fL (ref 79–97)
Monocytes Absolute: 0.4 10*3/uL (ref 0.1–0.9)
Monocytes: 7 %
Neutrophils Absolute: 3.5 10*3/uL (ref 1.4–7.0)
Neutrophils: 64 %
Platelets: 234 10*3/uL (ref 150–450)
RBC: 3.74 x10E6/uL — ABNORMAL LOW (ref 3.77–5.28)
RDW: 11.6 % — ABNORMAL LOW (ref 11.7–15.4)
WBC: 5.4 10*3/uL (ref 3.4–10.8)

## 2019-08-31 LAB — LIPID PANEL
Chol/HDL Ratio: 2.4 ratio (ref 0.0–4.4)
Cholesterol, Total: 185 mg/dL (ref 100–199)
HDL: 77 mg/dL (ref >39)
LDL Chol Calc (NIH): 96 mg/dL (ref 0–99)
Triglycerides: 62 mg/dL (ref 0–149)
VLDL Cholesterol Cal: 12 mg/dL (ref 5–40)

## 2019-08-31 LAB — CMP14+EGFR
ALT: 11 IU/L (ref 0–32)
AST: 17 IU/L (ref 0–40)
Albumin/Globulin Ratio: 1.9 (ref 1.2–2.2)
Albumin: 4.4 g/dL (ref 3.8–4.8)
Alkaline Phosphatase: 64 IU/L (ref 48–121)
BUN/Creatinine Ratio: 11 — ABNORMAL LOW (ref 12–28)
BUN: 11 mg/dL (ref 8–27)
Bilirubin Total: 0.9 mg/dL (ref 0.0–1.2)
CO2: 23 mmol/L (ref 20–29)
Calcium: 9.3 mg/dL (ref 8.7–10.3)
Chloride: 103 mmol/L (ref 96–106)
Creatinine, Ser: 0.96 mg/dL (ref 0.57–1.00)
GFR calc Af Amer: 71 mL/min/{1.73_m2} (ref >59)
GFR calc non Af Amer: 61 mL/min/{1.73_m2} (ref >59)
Globulin, Total: 2.3 g/dL (ref 1.5–4.5)
Glucose: 88 mg/dL (ref 65–99)
Potassium: 4.3 mmol/L (ref 3.5–5.2)
Sodium: 139 mmol/L (ref 134–144)
Total Protein: 6.7 g/dL (ref 6.0–8.5)

## 2019-08-31 LAB — THYROID PANEL WITH TSH
Free Thyroxine Index: 3.2 (ref 1.2–4.9)
T3 Uptake Ratio: 36 % (ref 24–39)
T4, Total: 8.8 ug/dL (ref 4.5–12.0)
TSH: 0.982 u[IU]/mL (ref 0.450–4.500)

## 2019-09-01 ENCOUNTER — Other Ambulatory Visit: Payer: Self-pay | Admitting: Nurse Practitioner

## 2019-09-01 DIAGNOSIS — Z1231 Encounter for screening mammogram for malignant neoplasm of breast: Secondary | ICD-10-CM

## 2019-09-01 LAB — CYTOLOGY - PAP

## 2019-09-27 DIAGNOSIS — H353110 Nonexudative age-related macular degeneration, right eye, stage unspecified: Secondary | ICD-10-CM | POA: Diagnosis not present

## 2019-09-27 DIAGNOSIS — H2513 Age-related nuclear cataract, bilateral: Secondary | ICD-10-CM | POA: Diagnosis not present

## 2019-09-27 DIAGNOSIS — H353190 Nonexudative age-related macular degeneration, unspecified eye, stage unspecified: Secondary | ICD-10-CM | POA: Diagnosis not present

## 2019-09-27 DIAGNOSIS — H5200 Hypermetropia, unspecified eye: Secondary | ICD-10-CM | POA: Diagnosis not present

## 2019-09-27 DIAGNOSIS — Z0100 Encounter for examination of eyes and vision without abnormal findings: Secondary | ICD-10-CM | POA: Diagnosis not present

## 2019-10-07 DIAGNOSIS — R69 Illness, unspecified: Secondary | ICD-10-CM | POA: Diagnosis not present

## 2019-10-12 ENCOUNTER — Ambulatory Visit
Admission: RE | Admit: 2019-10-12 | Discharge: 2019-10-12 | Disposition: A | Discharge status: Home / Self Care | Payer: Medicare HMO | Source: Ambulatory Visit | Attending: Nurse Practitioner | Admitting: Nurse Practitioner

## 2019-10-12 ENCOUNTER — Other Ambulatory Visit: Payer: Self-pay

## 2019-10-12 DIAGNOSIS — Z1231 Encounter for screening mammogram for malignant neoplasm of breast: Secondary | ICD-10-CM | POA: Diagnosis not present

## 2020-01-20 ENCOUNTER — Ambulatory Visit (INDEPENDENT_AMBULATORY_CARE_PROVIDER_SITE_OTHER): Payer: Medicare HMO | Admitting: Family

## 2020-01-20 ENCOUNTER — Encounter: Payer: Self-pay | Admitting: Family

## 2020-01-20 ENCOUNTER — Other Ambulatory Visit: Payer: Self-pay

## 2020-01-20 VITALS — BP 114/58 | HR 68 | Temp 97.3°F | Ht 65.0 in | Wt 132.0 lb

## 2020-01-20 DIAGNOSIS — M546 Pain in thoracic spine: Secondary | ICD-10-CM

## 2020-01-20 MED ORDER — BACLOFEN 10 MG PO TABS: 5.0000 mg | ORAL_TABLET | Freq: Three times a day (TID) | ORAL | 0 refills | Status: DC

## 2020-01-20 MED ORDER — PREDNISONE 10 MG (21) PO TBPK: ORAL_TABLET | ORAL | 0 refills | Status: DC

## 2020-01-20 NOTE — Patient Instructions (Signed)
Thoracic Strain A thoracic strain, which is sometimes called a mid-back strain, is an injury to the muscles or tendons that attach to the upper part of your back behind your chest. This type of injury occurs when a muscle is overstretched or overloaded. Thoracic strains can range from mild to severe. Mild strains may involve stretching a muscle or tendon without tearing it. These injuries may heal in 1-2 weeks. More severe strains involve tearing of muscle fibers or tendons. These will cause more pain and may take 6-8 weeks to heal. What are the causes? This condition may be caused by:  Trauma, such as a fall or a hit to the body.  Twisting or overstretching the back. This may result from doing activities that require a lot of energy, such as lifting heavy objects. In some cases, the cause may not be known. What increases the risk? This injury is more common in:  Athletes.  People with obesity. What are the signs or symptoms? The main symptom of this condition is pain in the middle back, especially with movement. Other symptoms include:  Stiffness or limited range of motion.  Sudden muscle tightening (spasms). How is this diagnosed? This condition may be diagnosed based on:  Your symptoms.  Your medical history.  A physical exam.  Imaging tests, such as X-rays or an MRI. How is this treated? This condition may be treated with:  Resting the injured area.  Applying heat and cold to the injured area.  Over-the-counter medicines for pain and inflammation, such as NSAIDs.  Prescription pain medicine or muscle relaxants may be needed for a short time.  Physical therapy. This will involve doing stretching and strengthening exercises. Follow these instructions at home: Managing pain, stiffness, and swelling      If directed, put ice on the injured area. ? Put ice in a plastic bag. ? Place a towel between your skin and the bag. ? Leave the ice on for 20 minutes, 2-3 times  a day.  If directed, apply heat to the affected area as often as told by your health care provider. Use the heat source that your health care provider recommends, such as a moist heat pack or a heating pad. ? Place a towel between your skin and the heat source. ? Leave the heat on for 20-30 minutes. ? Remove the heat if your skin turns bright red. This is especially important if you are unable to feel pain, heat, or cold. You may have a greater risk of getting burned. Activity  Rest and return to your normal activities as told by your health care provider. Ask your health care provider what activities are safe for you.  Do exercises as told by your health care provider. Medicines  Take over-the-counter and prescription medicines only as told by your health care provider.  Ask your health care provider if the medicine prescribed to you: ? Requires you to avoid driving or using heavy machinery. ? Can cause constipation. You may need to take these actions to prevent or treat constipation:  Drink enough fluid to keep your urine pale yellow.  Take over-the-counter or prescription medicines.  Eat foods that are high in fiber, such as beans, whole grains, and fresh fruits and vegetables.  Limit foods that are high in fat and processed sugars, such as fried or sweet foods. Injury prevention To prevent a future mid-back injury:  Always warm up properly before physical activity or sports.  Cool down and stretch after being active.  Use correct form when playing sports and lifting heavy objects. Bend your knees before you lift heavy objects. °· Use good posture when sitting and standing. °· Stay physically fit and maintain a healthy weight. °? Do at least 150 minutes of moderate-intensity exercise each week, such as brisk walking or water aerobics. °? Do strength exercises at least 2 times each week. ° °General instructions °· Do not use any products that contain nicotine or tobacco, such as  cigarettes, e-cigarettes, and chewing tobacco. If you need help quitting, ask your health care provider. °· Keep all follow-up visits as told by your health care provider. This is important. °Contact a health care provider if: °· Your pain is not helped by medicine. °· Your pain or stiffness is getting worse. °· You develop pain or stiffness in your neck or lower back. °Get help right away if you: °· Have shortness of breath. °· Have chest pain. °· Develop numbness or weakness in your legs or arms. °· Have involuntary loss of urine (urinary incontinence). °Summary °· A thoracic strain, which is sometimes called a mid-back strain, is an injury to the muscles or tendons that attach to the upper part of your back behind your chest. °· This type of injury occurs when a muscle is overstretched or overloaded. °· Rest and return to your normal activities as told by your health care provider. If directed, apply heat or ice to the affected area as often as told by your health care provider. °· Take over-the-counter and prescription medicines only as told by your health care provider. °· Contact a health care provider if you have new or worsening symptoms. °This information is not intended to replace advice given to you by your health care provider. Make sure you discuss any questions you have with your health care provider. °Document Revised: 11/17/2017 Document Reviewed: 11/17/2017 °Elsevier Patient Education © 2020 [employer] ° °

## 2020-01-20 NOTE — Progress Notes (Signed)
Subjective:    Patient ID: [name], female    DOB: September 04, 1950, 70 y.o.   MRN: [id number]  Chief Complaint  Patient presents with  . pulled muscle    In back thinks she pulled a week and half ago at the gym.    Back Pain This is a new problem. The current episode started 1 to 4 weeks ago. The problem occurs constantly. The problem has been waxing and waning since onset. The pain is present in the lumbar spine. The quality of the pain is described as aching. The pain is at a severity of 8/10. The pain is moderate. The symptoms are aggravated by standing and twisting. Pertinent negatives include no fever, numbness, paresis, perianal numbness or tingling. She has tried NSAIDs and heat (tylenol) for the symptoms. The treatment provided mild relief.      Review of Systems  Constitutional: Negative for fever.  Musculoskeletal: Positive for back pain.  Neurological: Negative for tingling and numbness.  All other systems reviewed and are negative.      Objective:   Physical Exam Vitals reviewed.  Constitutional:      General: She is not in acute distress.    Appearance: She is well-developed and well-nourished.  HENT:     Head: Normocephalic and atraumatic.     Mouth/Throat:     Mouth: Oropharynx is clear and moist.  Eyes:     Pupils: Pupils are equal, round, and reactive to light.  Neck:     Thyroid: No thyromegaly.  Cardiovascular:     Rate and Rhythm: Normal rate and regular rhythm.     Pulses: Intact distal pulses.     Heart sounds: Normal heart sounds. No murmur heard.   Pulmonary:     Effort: Pulmonary effort is normal. No respiratory distress.     Breath sounds: Normal breath sounds. No wheezing.  Abdominal:     General: Bowel sounds are normal. There is no distension.     Palpations: Abdomen is soft.     Tenderness: There is no abdominal tenderness.  Musculoskeletal:        General: No tenderness or edema. Normal range of motion.       Arms:     Cervical  back: Normal range of motion and neck supple.  Skin:    General: Skin is warm and dry.  Neurological:     Mental Status: She is alert and oriented to person, place, and time.     Cranial Nerves: No cranial nerve deficit.     Deep Tendon Reflexes: Reflexes are normal and symmetric.  Psychiatric:        Mood and Affect: Mood and affect normal.        Behavior: Behavior normal.        Thought Content: Thought content normal.        Judgment: Judgment normal.       BP (!) 114/58   Pulse 68   Temp (!) 97.3 F (36.3 C) (Temporal)   Ht 5\' 5"  (1.651 m)   Wt 132 lb (59.9 kg)   BMI 21.97 kg/m      Assessment & Plan:  [name] comes in today with chief complaint of pulled muscle (In back thinks she pulled a week and half ago at the gym.)   Diagnosis and orders addressed:  1. Acute right-sided thoracic back pain Rest ROM exercises encouraged Sedation precautions RTO if symptoms worsen or do not improve   - predniSONE (STERAPRED  UNI-PAK 21 TAB) 10 MG (21) TBPK tablet; Use as directed  Dispense: 21 tablet; Refill: 0 - baclofen (LIORESAL) 10 MG tablet; Take 0.5 tablets (5 mg total) by mouth 3 (three) times daily.  Dispense: 30 each; Refill: 0   Evelina Dun, FNP

## 2020-06-15 ENCOUNTER — Encounter: Payer: Self-pay | Admitting: Family Medicine

## 2020-06-15 ENCOUNTER — Ambulatory Visit (INDEPENDENT_AMBULATORY_CARE_PROVIDER_SITE_OTHER): Payer: Medicare HMO | Admitting: Family Medicine

## 2020-06-15 ENCOUNTER — Other Ambulatory Visit: Payer: Self-pay

## 2020-06-15 VITALS — BP 124/62 | HR 64 | Temp 97.8°F | Ht 65.0 in | Wt 132.2 lb

## 2020-06-15 DIAGNOSIS — S70362A Insect bite (nonvenomous), left thigh, initial encounter: Secondary | ICD-10-CM | POA: Diagnosis not present

## 2020-06-15 DIAGNOSIS — W57XXXA Bitten or stung by nonvenomous insect and other nonvenomous arthropods, initial encounter: Secondary | ICD-10-CM | POA: Diagnosis not present

## 2020-06-15 MED ORDER — CETIRIZINE HCL 10 MG PO TABS: 10.0000 mg | ORAL_TABLET | Freq: Every day | ORAL | 0 refills | Status: DC

## 2020-06-15 MED ORDER — HYDROCORTISONE 1 % EX CREA: 1.0000 "application " | TOPICAL_CREAM | Freq: Two times a day (BID) | CUTANEOUS | 0 refills | Status: DC

## 2020-06-15 NOTE — Progress Notes (Signed)
Acute Office Visit  Subjective:    Patient ID: [name], female    DOB: March 27, 1950, 70 y.o.   MRN: [id number]  Chief Complaint  Patient presents with  . Insect Bite    HPI Patient is in today for a tick bite that occurred 3 days ago. She removed the tick. It was attached for less than a day. The area has a welt around it and it is itchy. Denies warmth, tenderness, or drainage. She denies fever, chills, neck pain, weakness, headache, nausea, vomiting, or myalgias. Denies rash.   Past Medical History:  Diagnosis Date  . GAD (generalized anxiety disorder)   . GAD (generalized anxiety disorder)   . GERD (gastroesophageal reflux disease) 02/13/2013  . Heart murmur   . Hematuria   . Lesion of bladder   . Renal insufficiency 02/12/2013    Past Surgical History:  Procedure Laterality Date  . APPENDECTOMY  03-09-2008  . CHOLECYSTECTOMY  1990  . CHOLECYSTECTOMY    . CYSTOSCOPY W/ RETROGRADES Bilateral 01/24/2013   Procedure: CYSTOSCOPY WITH RETROGRADE PYELOGRAM;  Surgeon: Molli Hazard, MD;  Location: [facility];  Service: Urology;  Laterality: Bilateral;  . CYSTOSCOPY WITH BIOPSY N/A 01/24/2013   Procedure: CYSTOSCOPY WITH BIOPSY;  Surgeon: Molli Hazard, MD;  Location: [facility];  Service: Urology;  Laterality: N/A;  . LAPAROSCOPIC APPENDECTOMY N/A 2011  . OPEN HIATAL HERNIA REPAIR  1985  . TUBAL LIGATION    . TUBAL LIGATION Bilateral     Family History  Problem Relation Age of Onset  . COPD Mother   . Cancer Father     Social History   Socioeconomic History  . Marital status: Married    Spouse name: Not on file  . Number of children: Not on file  . Years of education: Not on file  . Highest education level: Not on file  Occupational History  . Not on file  Tobacco Use  . Smoking status: Never Smoker  . Smokeless tobacco: Never Used  Substance and Sexual Activity  . Alcohol use: No  . Drug use: No  . Sexual  activity: Yes    Birth control/protection: Post-menopausal  Other Topics Concern  . Not on file  Social History Narrative  . Not on file   Social Determinants of Health   Financial Resource Strain: Not on file  Food Insecurity: Not on file  Transportation Needs: Not on file  Physical Activity: Not on file  Stress: Not on file  Social Connections: Not on file  Intimate Partner Violence: Not on file    Outpatient Medications Prior to Visit  Medication Sig Dispense Refill  . baclofen (LIORESAL) 10 MG tablet Take 0.5 tablets (5 mg total) by mouth 3 (three) times daily. 30 each 0  . predniSONE (STERAPRED UNI-PAK 21 TAB) 10 MG (21) TBPK tablet Use as directed 21 tablet 0   No facility-administered medications prior to visit.    Allergies  Allergen Reactions  . Asa [Aspirin] Other (See Comments)    Contraindicated for hernia  . Ciprofloxacin Hcl Other (See Comments)    Burning  both legs below knees as if on fire.  . Septra Ds [Sulfamethoxazole-Trimethoprim]     Rapid HR, sweats , and low blood sugar  . Tagamet [Cimetidine] Rash    Review of Systems As per HPI.     Objective:    Physical Exam Vitals and nursing note reviewed.  Constitutional:      General: She  is not in acute distress.    Appearance: Normal appearance. She is not ill-appearing or toxic-appearing.  Skin:    General: Skin is warm and dry.     Findings: No rash.     Comments: Insect bite present to upper left thigh with localized erythema and swelling. No warmth, tenderness, or drainage. No rash.   Neurological:     General: No focal deficit present.     Mental Status: She is alert and oriented to person, place, and time.  Psychiatric:        Mood and Affect: Mood normal.        Behavior: Behavior normal.     There were no vitals taken for this visit. Wt Readings from Last 3 Encounters:  01/20/20 132 lb (59.9 kg)  08/30/19 129 lb (58.5 kg)  05/06/18 126 lb (57.2 kg)    Health Maintenance Due   Topic Date Due  . COVID-19 Vaccine (1) Never done  . Zoster Vaccines- Shingrix (1 of 2) Never done  . DEXA SCAN  02/20/2020    There are no preventive care reminders to display for this patient.   Lab Results  Component Value Date   TSH 0.982 08/30/2019   Lab Results  Component Value Date   WBC 5.4 08/30/2019   HGB 12.3 08/30/2019   HCT 35.4 08/30/2019   MCV 95 08/30/2019   PLT 234 08/30/2019   Lab Results  Component Value Date   NA 139 08/30/2019   K 4.3 08/30/2019   CO2 23 08/30/2019   GLUCOSE 88 08/30/2019   BUN 11 08/30/2019   CREATININE 0.96 08/30/2019   BILITOT 0.9 08/30/2019   ALKPHOS 64 08/30/2019   AST 17 08/30/2019   ALT 11 08/30/2019   PROT 6.7 08/30/2019   ALBUMIN 4.4 08/30/2019   CALCIUM 9.3 08/30/2019   Lab Results  Component Value Date   CHOL 185 08/30/2019   Lab Results  Component Value Date   HDL 77 08/30/2019   Lab Results  Component Value Date   LDLCALC 96 08/30/2019   Lab Results  Component Value Date   TRIG 62 08/30/2019   Lab Results  Component Value Date   CHOLHDL 2.4 08/30/2019   No results found for: HGBA1C     Assessment & Plan:   [name] was seen today for insect bite.  Diagnoses and all orders for this visit:  Tick bite of left thigh, initial encounter Zyrtec for itching. Hydrocortisone cream as needed. Return precautions given.  -     hydrocortisone cream 1 %; Apply 1 application topically 2 (two) times daily. -     cetirizine (ZYRTEC) 10 MG tablet; Take 1 tablet (10 mg total) by mouth daily.  Return to office for new or worsening symptoms, or if symptoms persist.   The patient indicates understanding of these issues and agrees with the plan.   Gwenlyn Perking, FNP

## 2020-06-15 NOTE — Patient Instructions (Signed)
Insect Bite, Adult An insect bite can make your skin red, itchy, and swollen. An insect bite is different from an insect sting, which happens when an insect injects poison (venom) into the skin. Some insects can spread disease to people through a bite. However, most insect bites do not lead to disease and are not serious. What are the causes? Insects may bite for a variety of reasons, including:  Hunger.  To defend themselves. Insects that bite include:  Spiders.  Mosquitoes.  Ticks.  Fleas.  Ants.  Flies.  Kissing bugs.  Chiggers. What are the signs or symptoms? Symptoms of this condition include:  Itching or pain in the bite area.  Redness and swelling in the bite area.  An open wound (skin ulcer). In many cases, symptoms last for 2-4 days. In rare cases, a person may have a severe allergic reaction (anaphylactic reaction) to a bite. Symptoms of an anaphylactic reaction may include:  Feeling warm in the face (flushed). This may include redness.  Itchy, red, swollen areas of skin (hives).  Swelling of the eyes, lips, face, mouth, tongue, or throat.  Difficulty breathing, speaking, or swallowing.  Noisy breathing (wheezing).  Dizziness or light-headedness.  Fainting.  Pain or cramping in the abdomen.  Vomiting.  Diarrhea. How is this diagnosed? This condition is usually diagnosed based on symptoms and a physical exam. How is this treated? Treatment is usually not needed. Symptoms often go away on their own. When treatment is recommended, it may involve:  Applying a cream or lotion to the bite area. This treatment helps with itching.  Taking an antibiotic medicine. This treatment is needed if the bite area gets infected.  Getting a tetanus shot, if you are not up to date on this vaccine.  Applying ice to the affected area.  Allergy medicines called antihistamines. This treatment may be needed if you develop itching or an allergic reaction to the  insect bite.  Giving yourself an epinephrine injection if you have an anaphylactic reaction to a bite. To give the injection, you will use what is commonly called an auto-injector "pen" (pre-filled automatic epinephrine injection device). Your health care provider will teach you how to use an auto-injector pen. Follow these instructions at home: Bite area care  Do not scratch the bite area.  Keep the bite area clean and dry. Wash it every day with soap and water as told by your health care provider.  Check the bite area every day for signs of infection. Check for: ? Redness, swelling, or pain. ? Fluid or blood. ? Warmth. ? Pus or a bad smell.   Managing pain, itching, and swelling  You may apply cortisone cream, calamine lotion, or a paste made of baking soda and water to the bite area as told by your health care provider.  If directed, put ice on the bite area. ? Put ice in a plastic bag. ? Place a towel between your skin and the bag. ? Leave the ice on for 20 minutes, 2-3 times a day.   General instructions  Apply or take over-the-counter and prescription medicines only as told by your health care provider.  If you were prescribed an antibiotic medicine, take or apply it as told by your health care provider. Do not stop using the antibiotic even if your condition improves.  Keep all follow-up visits as told by your health care provider. This is important. How is this prevented? To help reduce your risk of insect bites:  When you  are outdoors, wear clothing that covers your arms and legs. This is especially important in the early morning and evening.  Use insect repellent. The best insect repellents contain DEET, picaridin, oil of lemon eucalyptus (OLE), or IR3535.  Consider spraying your clothing with a pesticide called permethrin. Permethrin helps prevent insect bites. It works for several weeks and for up to 5-6 clothing washes. Do not apply permethrin directly to the  skin.  If your home windows do not have screens, consider installing them.  If you will be sleeping in an area where there are mosquitoes, consider covering your sleeping area with a mosquito net. Contact a health care provider if:  You have redness, swelling, or pain in the bite area.  You have fluid or blood coming from the bite area.  The bite area feels warm to the touch.  You have pus or a bad smell coming from the bite area.  You have a fever. Get help right away if:  You have joint pain.  You have a rash.  You feel unusually tired or sleepy.  You have neck pain.  You have a headache.  You have unusual weakness.  You develop symptoms of an anaphylactic reaction. These may include: ? Flushed skin. ? Hives. ? Swelling of the eyes, lips, face, mouth, tongue, or throat. ? Difficulty breathing, speaking, or swallowing. ? Wheezing. ? Dizziness or light-headedness. ? Fainting. ? Pain or cramping in the abdomen. ? Vomiting. ? Diarrhea. These symptoms may represent a serious problem that is an emergency. Do not wait to see if the symptoms will go away. Do the following right away:  Use the auto-injector pen as you have been instructed.  Get medical help. Call your local emergency services (911 in the [place] Do not drive yourself to the hospital. Summary  An insect bite can make your skin red, itchy, and swollen.  Treatment is usually not needed. Symptoms often go away on their own. When treatment is recommended, it may involve taking medicine, applying medicine to the area, or applying ice.  Apply or take over-the-counter and prescription medicines only as told by your health care provider.  Use insect repellent to help prevent insect bites.  Contact a health care provider if you have any signs of infection in the bite area. This information is not intended to replace advice given to you by your health care provider. Make sure you discuss any questions you have  with your health care provider. Document Revised: 07/10/2017 Document Reviewed: 07/10/2017 Elsevier Patient Education  2021 [employer].

## 2020-07-23 ENCOUNTER — Encounter: Payer: Self-pay | Admitting: Family Medicine

## 2020-07-23 ENCOUNTER — Other Ambulatory Visit: Payer: Self-pay

## 2020-07-23 ENCOUNTER — Ambulatory Visit (INDEPENDENT_AMBULATORY_CARE_PROVIDER_SITE_OTHER): Payer: Medicare HMO | Admitting: Family Medicine

## 2020-07-23 VITALS — BP 120/58 | HR 63 | Temp 96.9°F | Ht 65.0 in | Wt 130.0 lb

## 2020-07-23 DIAGNOSIS — S161XXA Strain of muscle, fascia and tendon at neck level, initial encounter: Secondary | ICD-10-CM

## 2020-07-23 MED ORDER — TIZANIDINE HCL 4 MG PO TABS: 4.0000 mg | ORAL_TABLET | Freq: Four times a day (QID) | ORAL | 0 refills | Status: DC | PRN

## 2020-07-23 NOTE — Progress Notes (Signed)
Acute Office Visit  Subjective:    Patient ID: [name], female    DOB: 01-20-1950, 70 y.o.   MRN: [id number]  Chief Complaint  Patient presents with   Neck Pain    HPI Patient is in today for neck pain x3 days. The pain is along both sides of her neck. The pain occurs when she turns her head to the side. It feels a little sore when she is resting. The pain will radiate up her neck when she turn her neck. Pain is a 7/10. She denies injury. She is having muscle spasms. She has tried tylenol, icy hot, heat, and tylenol. Denies fever.   Past Medical History:  Diagnosis Date   GAD (generalized anxiety disorder)    GAD (generalized anxiety disorder)    GERD (gastroesophageal reflux disease) 02/13/2013   Heart murmur    Hematuria    Lesion of bladder    Renal insufficiency 02/12/2013    Past Surgical History:  Procedure Laterality Date   APPENDECTOMY  03-09-2008   [place] W/ RETROGRADES Bilateral 01/24/2013   Procedure: CYSTOSCOPY WITH RETROGRADE PYELOGRAM;  Surgeon: Molli Hazard, MD;  Location: [facility];  Service: Urology;  Laterality: Bilateral;   CYSTOSCOPY WITH BIOPSY N/A 01/24/2013   Procedure: CYSTOSCOPY WITH BIOPSY;  Surgeon: Molli Hazard, MD;  Location: [facility];  Service: Urology;  Laterality: N/A;   LAPAROSCOPIC APPENDECTOMY N/A 2011   OPEN HIATAL HERNIA REPAIR  1985   TUBAL LIGATION     TUBAL LIGATION Bilateral     Family History  Problem Relation Age of Onset   COPD Mother    Cancer Father     Social History   Socioeconomic History   Marital status: Married    Spouse name: Not on file   Number of children: Not on file   Years of education: Not on file   Highest education level: Not on file  Occupational History   Not on file  Tobacco Use   Smoking status: Never   Smokeless tobacco: Never  Substance and Sexual Activity   Alcohol use: No   Drug use:  No   Sexual activity: Yes    Birth control/protection: Post-menopausal  Other Topics Concern   Not on file  Social History Narrative   Not on file   Social Determinants of Health   Financial Resource Strain: Not on file  Food Insecurity: Not on file  Transportation Needs: Not on file  Physical Activity: Not on file  Stress: Not on file  Social Connections: Not on file  Intimate Partner Violence: Not on file    Outpatient Medications Prior to Visit  Medication Sig Dispense Refill   cetirizine (ZYRTEC) 10 MG tablet Take 1 tablet (10 mg total) by mouth daily. (Patient not taking: Reported on 07/23/2020) 30 tablet 0   hydrocortisone cream 1 % Apply 1 application topically 2 (two) times daily. 30 g 0   No facility-administered medications prior to visit.    Allergies  Allergen Reactions   Asa [Aspirin] Other (See Comments)    Contraindicated for hernia   Ciprofloxacin Hcl Other (See Comments)    Burning  both legs below knees as if on fire.   Septra Ds [Sulfamethoxazole-Trimethoprim]     Rapid HR, sweats , and low blood sugar   Tagamet [Cimetidine] Rash    Review of Systems As per HPI.     Objective:  Physical Exam Vitals and nursing note reviewed.  Constitutional:      General: She is not in acute distress.    Appearance: She is not toxic-appearing or diaphoretic.  Pulmonary:     Effort: Pulmonary effort is normal. No respiratory distress.  Musculoskeletal:     Cervical back: No edema, erythema, rigidity, tenderness or crepitus. Pain with movement (with rotation) and muscular tenderness present. No spinous process tenderness.  Skin:    General: Skin is warm and dry.  Neurological:     General: No focal deficit present.     Mental Status: She is alert and oriented to person, place, and time.  Psychiatric:        Mood and Affect: Mood normal.        Behavior: Behavior normal.    There were no vitals taken for this visit. Wt Readings from Last 3 Encounters:   06/15/20 132 lb 3.2 oz (60 kg)  01/20/20 132 lb (59.9 kg)  08/30/19 129 lb (58.5 kg)    Health Maintenance Due  Topic Date Due   COVID-19 Vaccine (1) Never done   Zoster Vaccines- Shingrix (1 of 2) Never done   DEXA SCAN  02/20/2020    There are no preventive care reminders to display for this patient.   Lab Results  Component Value Date   TSH 0.982 08/30/2019   Lab Results  Component Value Date   WBC 5.4 08/30/2019   HGB 12.3 08/30/2019   HCT 35.4 08/30/2019   MCV 95 08/30/2019   PLT 234 08/30/2019   Lab Results  Component Value Date   NA 139 08/30/2019   K 4.3 08/30/2019   CO2 23 08/30/2019   GLUCOSE 88 08/30/2019   BUN 11 08/30/2019   CREATININE 0.96 08/30/2019   BILITOT 0.9 08/30/2019   ALKPHOS 64 08/30/2019   AST 17 08/30/2019   ALT 11 08/30/2019   PROT 6.7 08/30/2019   ALBUMIN 4.4 08/30/2019   CALCIUM 9.3 08/30/2019   Lab Results  Component Value Date   CHOL 185 08/30/2019   Lab Results  Component Value Date   HDL 77 08/30/2019   Lab Results  Component Value Date   LDLCALC 96 08/30/2019   Lab Results  Component Value Date   TRIG 62 08/30/2019   Lab Results  Component Value Date   CHOLHDL 2.4 08/30/2019   No results found for: HGBA1C     Assessment & Plan:   [name] was seen today for neck pain.  Diagnoses and all orders for this visit:  Strain of neck muscle, initial encounter Steroid IM injection today in office. Zanaflex ordered. Rest, heat, ice as needed.  -     tiZANidine (ZANAFLEX) 4 MG tablet; Take 1 tablet (4 mg total) by mouth every 6 (six) hours as needed for muscle spasms. -     methylPREDNISolone acetate (DEPO-MEDROL) injection 80 mg  Return to office for new or worsening symptoms, or if symptoms persist.   The patient indicates understanding of these issues and agrees with the plan.   Gwenlyn Perking, FNP

## 2020-07-23 NOTE — Patient Instructions (Signed)
Cervical Sprain A cervical sprain is a stretch or tear in one or more of the ligaments in the neck. Ligaments are the tissues that connect bones. Cervical sprains can range from mild to severe. Severe cervical sprains can cause the spinal bones (vertebrae) in the neck to be unstable. This can result in spinal cord damage and in serious nervous system problems. The time that it takes for a cervical sprain to heal depends on the cause and extent of the injury. Most cervical sprains heal in 4-6 weeks. What are the causes? Cervical sprains may be caused by trauma, such as an injury from a motor vehicle accident, a fall, or a sudden forward and backward whipping movement of the head and neck (whiplash injury). Mild cervical sprains may be caused by wear and tear over time. What increases the risk? The following factors may make you more likely to develop this condition: Participating in activities that have a high risk of trauma to the neck. These include contact sports, auto racing, gymnastics, and diving. Taking risks when driving or riding in a motor vehicle. Osteoarthritis of the spine. Poor strength and flexibility of the neck. A previous neck injury. Poor posture. Spending long periods in certain positions that put stress on the neck, such as sitting at a computer for a long time. What are the signs or symptoms? Symptoms of this condition include: Pain, soreness, stiffness, tenderness, swelling, or a burning sensation in the front, back, or sides of the neck, shoulders, or upper back. Sudden tightening of neck muscles (spasms). Limited ability to move the neck. Headache. Dizziness. Nausea or vomiting. Weakness, numbness, or tingling in a hand or an arm. Symptoms may develop right away after injury, or they may develop over a few days. In some cases, symptoms may go away with treatment and return (recur) over time. How is this diagnosed? This condition may be diagnosed based on: Your  medical history. Your symptoms. Any recent injuries or known neck problems that you have, such as arthritis in the neck. A physical exam. Imaging tests, such as X-rays, MRI, and CT scan. How is this treated? This condition is treated by resting and icing the injured area and doing physical therapy exercises. Heat therapy may be used 2-3 days after the injury occurred if there is no swelling. Depending on the severity of your condition, treatment may also include: Keeping your neck in place (immobilized) for periods of time. This may be done using: A cervical collar. This supports your chin and the back of your head. A cervical traction device. This is a sling that holds up your head. The device removes weight and pressure from your neck, and it may help to relieve pain. Medicines that help to relieve pain and inflammation. Medicines that help to relax your muscles (muscle relaxants). Surgery. This is rare. Follow these instructions at home: Medicines  Take over-the-counter and prescription medicines only as told by your health care provider. Ask your health care provider if the medicine prescribed to you: Requires you to avoid driving or using heavy machinery. Can cause constipation. You may need to take these actions to prevent or treat constipation: Drink enough fluid to keep your urine pale yellow. Take over-the-counter or prescription medicines. Eat foods that are high in fiber, such as beans, whole grains, and fresh fruits and vegetables. Limit foods that are high in fat and processed sugars, such as fried or sweet foods. If you have a cervical collar: Wear the collar as told by your   health care provider. Do not remove it unless told. Ask before making any adjustments to your collar. If you have long hair, keep it outside of the collar. Ask your health care provider if you may remove the collar for cleaning and bathing. If so: Follow instructions about how to remove it  safely. Clean it by hand with mild soap and water and air-dry it completely. If your collar has removable pads, remove them every 1-2 days and wash them by hand with soap and water. Let them air-dry completely before putting them back in the collar. Tell your health care provider if your skin under the collar has irritation or sores. Managing pain, stiffness, and swelling   If directed, use a cervical traction device as told. If directed, put ice on the affected area. To do this: Put ice in a plastic bag. Place a towel between your skin and the bag. Leave the ice on for 20 minutes, 2-3 times a day. If directed, apply heat to the affected area before you do your physical therapy or as often as told by your health care provider. Use the heat source that your health care provider recommends, such as a moist heat pack or a heating pad. Place a towel between your skin and the heat source. Leave the heat on for 20-30 minutes. Remove the heat if your skin turns bright red. This is especially important if you are unable to feel pain, heat, or cold. You may have a greater risk of getting burned. Activity Do not drive while wearing a cervical collar. If you do not have a cervical collar, ask if it is safe to drive while your neck heals. Do not lift anything that is heavier than 10 lb (4.5 kg), or the limit that you are told, until your health care provider says that it is safe. Rest as told by your health care provider. If physical therapy was prescribed, do exercises as told by your health care provider or physical therapist. Return to your normal activities as told by your health care provider. Avoid positions and activities that make your symptoms worse. Ask your health care provider what activities are safe for you. General instructions Do not use any products that contain nicotine or tobacco, such as cigarettes, e-cigarettes, and chewing tobacco. These can delay healing. If you need help quitting,  ask your health care provider. Keep all follow-up visits as told by your health care provider or physical therapist. This is important. How is this prevented? To prevent a cervical sprain from happening again: Use and maintain good posture. Make any needed adjustments to your workstation to help you do this. Exercise regularly as told by your health care provider or physical therapist. Avoid risky activities that may cause a cervical sprain. Contact a health care provider if you have: Symptoms that get worse or do not get better after 2 weeks of treatment. Pain that gets worse or does not get better with medicine. New, unexplained symptoms. Sores or irritated skin on your neck from wearing your cervical collar. Get help right away if: You have severe pain. You develop numbness, tingling, or weakness in any part of your body. You cannot move a part of your body (you have paralysis). You have neck pain along with severe dizziness or headache. Summary A cervical sprain is a stretch or tear in one or more of the ligaments in the neck. Cervical sprains may be caused by trauma, such as an injury from a motor vehicle accident, a   fall, or a sudden forward and backward whipping movement of the head and neck (whiplash injury). Symptoms may develop right away after injury, or they may develop over a few days. This condition may be treated with rest, ice, heat, medicines, physical therapy, and surgery. This information is not intended to replace advice given to you by your health care provider. Make sure you discuss any questions you have with your health care provider. Document Revised: 09/08/2018 Document Reviewed: 09/08/2018 Elsevier Patient Education  2022 [employer]  

## 2020-07-24 MED ORDER — METHYLPREDNISOLONE ACETATE 40 MG/ML IJ SUSP
80.0000 mg | Freq: Once | INTRAMUSCULAR | Status: AC
  Administered 2020-07-23: 80 mg via INTRAMUSCULAR

## 2020-07-25 ENCOUNTER — Encounter: Payer: Self-pay | Admitting: Family Medicine

## 2020-08-13 DIAGNOSIS — D2272 Melanocytic nevi of left lower limb, including hip: Secondary | ICD-10-CM | POA: Diagnosis not present

## 2020-08-13 DIAGNOSIS — D2262 Melanocytic nevi of left upper limb, including shoulder: Secondary | ICD-10-CM | POA: Diagnosis not present

## 2020-08-13 DIAGNOSIS — L821 Other seborrheic keratosis: Secondary | ICD-10-CM | POA: Diagnosis not present

## 2020-08-13 DIAGNOSIS — D2261 Melanocytic nevi of right upper limb, including shoulder: Secondary | ICD-10-CM | POA: Diagnosis not present

## 2020-08-13 DIAGNOSIS — H61001 Unspecified perichondritis of right external ear: Secondary | ICD-10-CM | POA: Diagnosis not present

## 2020-08-13 DIAGNOSIS — L570 Actinic keratosis: Secondary | ICD-10-CM | POA: Diagnosis not present

## 2020-08-30 ENCOUNTER — Ambulatory Visit (INDEPENDENT_AMBULATORY_CARE_PROVIDER_SITE_OTHER): Payer: Medicare HMO

## 2020-08-30 ENCOUNTER — Other Ambulatory Visit: Payer: Self-pay

## 2020-08-30 ENCOUNTER — Ambulatory Visit (INDEPENDENT_AMBULATORY_CARE_PROVIDER_SITE_OTHER): Payer: Medicare HMO | Admitting: Nurse Practitioner

## 2020-08-30 ENCOUNTER — Encounter: Payer: Self-pay | Admitting: Nurse Practitioner

## 2020-08-30 VITALS — BP 119/60 | HR 62 | Temp 98.1°F | Resp 20 | Ht 65.0 in | Wt 126.0 lb

## 2020-08-30 DIAGNOSIS — M8588 Other specified disorders of bone density and structure, other site: Secondary | ICD-10-CM | POA: Diagnosis not present

## 2020-08-30 DIAGNOSIS — F411 Generalized anxiety disorder: Secondary | ICD-10-CM

## 2020-08-30 DIAGNOSIS — M8589 Other specified disorders of bone density and structure, multiple sites: Secondary | ICD-10-CM | POA: Diagnosis not present

## 2020-08-30 DIAGNOSIS — Z0001 Encounter for general adult medical examination with abnormal findings: Secondary | ICD-10-CM | POA: Diagnosis not present

## 2020-08-30 DIAGNOSIS — Z0000 Encounter for general adult medical examination without abnormal findings: Secondary | ICD-10-CM | POA: Diagnosis not present

## 2020-08-30 DIAGNOSIS — M8580 Other specified disorders of bone density and structure, unspecified site: Secondary | ICD-10-CM | POA: Diagnosis not present

## 2020-08-30 DIAGNOSIS — R6889 Other general symptoms and signs: Secondary | ICD-10-CM | POA: Diagnosis not present

## 2020-08-30 DIAGNOSIS — R69 Illness, unspecified: Secondary | ICD-10-CM | POA: Diagnosis not present

## 2020-08-30 DIAGNOSIS — K219 Gastro-esophageal reflux disease without esophagitis: Secondary | ICD-10-CM | POA: Diagnosis not present

## 2020-08-30 DIAGNOSIS — Z136 Encounter for screening for cardiovascular disorders: Secondary | ICD-10-CM | POA: Diagnosis not present

## 2020-08-30 NOTE — Progress Notes (Signed)
Subjective:    Patient ID: [name], female    DOB: 10-27-1950, 70 y.o.   MRN: [id number]   Chief Complaint: Annual Exam    HPI:  1. Annual physical exam Had pap in 2021 and was normal. Has been menopausal for years.  2. Gastroesophageal reflux disease without esophagitis Only has symptoms occasionally. Denies any recent flare ups  3. GAD (generalized anxiety disorder) Doing well. Doe snot need any medication. GAD 7 : Generalized Anxiety Score 08/30/2020 06/15/2020  Nervous, Anxious, on Edge 0 0  Control/stop worrying 0 0  Worry too much - different things 0 0  Trouble relaxing 0 0  Restless 0 0  Easily annoyed or irritable 0 0  Afraid - awful might happen 0 0  Total GAD 7 Score 0 0  Anxiety Difficulty Not difficult at all Not difficult at all      4. Osteopenia of lumbar spine Last dexascan was done 02/19/18. T score -2.1. Has been exercising at gym 3x a week.    Outpatient Encounter Medications as of 08/30/2020  Medication Sig   cetirizine (ZYRTEC) 10 MG tablet Take 1 tablet (10 mg total) by mouth daily. (Patient not taking: No sig reported)   [DISCONTINUED] tiZANidine (ZANAFLEX) 4 MG tablet Take 1 tablet (4 mg total) by mouth every 6 (six) hours as needed for muscle spasms.   No facility-administered encounter medications on file as of 08/30/2020.    Past Surgical History:  Procedure Laterality Date   APPENDECTOMY  03-09-2008   CHOLECYSTECTOMY  1990   CHOLECYSTECTOMY     [place]STOS[place] W/ RETROGRADES Bilateral 01/24/2013   Procedure: CYSTOSCOPY WITH RETROGRADE PYELOGRAM;  Surgeon: Molli Hazard, MD;  Location: [facility];  Service: Urology;  Laterality: Bilateral;   CYSTOSCOPY WITH BIOPSY N/A 01/24/2013   Procedure: CYSTOSCOPY WITH BIOPSY;  Surgeon: Molli Hazard, MD;  Location: [facility];  Service: Urology;  Laterality: N/A;   LAPAROSCOPIC APPENDECTOMY N/A 2011   OPEN HIATAL HERNIA REPAIR  1985   TUBAL LIGATION      TUBAL LIGATION Bilateral     Family History  Problem Relation Age of Onset   COPD Mother    Cancer Father     New complaints: None today  Social history: Lives with husband  Controlled substance contract: n/a    Review of Systems  Constitutional:  Negative for diaphoresis.  Eyes:  Negative for pain.  Respiratory:  Negative for shortness of breath.   Cardiovascular:  Negative for chest pain, palpitations and leg swelling.  Gastrointestinal:  Negative for abdominal pain.  Endocrine: Negative for polydipsia.  Skin:  Negative for rash.  Neurological:  Negative for dizziness, weakness and headaches.  Hematological:  Does not bruise/bleed easily.  All other systems reviewed and are negative.     Objective:   Physical Exam Vitals and nursing note reviewed.  Constitutional:      General: She is not in acute distress.    Appearance: Normal appearance. She is well-developed.  HENT:     Head: Normocephalic.     Right Ear: Tympanic membrane normal.     Left Ear: Tympanic membrane normal.     Nose: Nose normal.     Mouth/Throat:     Mouth: Mucous membranes are moist.  Eyes:     Pupils: Pupils are equal, round, and reactive to light.  Neck:     Vascular: No carotid bruit or JVD.  Cardiovascular:     Rate and Rhythm: Normal  rate and regular rhythm.     Heart sounds: Normal heart sounds.  Pulmonary:     Effort: Pulmonary effort is normal. No respiratory distress.     Breath sounds: Normal breath sounds. No wheezing or rales.  Chest:     Chest wall: No tenderness.  Abdominal:     General: Bowel sounds are normal. There is no distension or abdominal bruit.     Palpations: Abdomen is soft. There is no hepatomegaly, splenomegaly, mass or pulsatile mass.     Tenderness: There is no abdominal tenderness.  Musculoskeletal:        General: Normal range of motion.     Cervical back: Normal range of motion and neck supple.  Lymphadenopathy:     Cervical: No cervical  adenopathy.  Skin:    General: Skin is warm and dry.  Neurological:     Mental Status: She is alert and oriented to person, place, and time.     Deep Tendon Reflexes: Reflexes are normal and symmetric.  Psychiatric:        Behavior: Behavior normal.        Thought Content: Thought content normal.        Judgment: Judgment normal.   BP 119/60   Pulse 62   Temp 98.1 F (36.7 C) (Temporal)   Resp 20   Ht 5' 5" (1.651 m)   Wt 126 lb (57.2 kg)   SpO2 98%   BMI 20.97 kg/m         Assessment & Plan:  [name] comes in today with chief complaint of Annual Exam   Diagnosis and orders addressed:  1. Annual physical exam  2. Gastroesophageal reflux disease without esophagitis Avoid spicy foods Do not eat 2 hours prior to bedtime   3. GAD (generalized anxiety disorder) Stress management  4. Osteopenia of lumbar spine Continue weight bearing exercises.  Orders Placed This Encounter  Procedures   DG [facility] DEXA    Order Specific Question:   Reason for Exam (SYMPTOM  OR DIAGNOSIS REQUIRED)    Answer:   screening   CMP14+EGFR   CBC with Differential/Platelet   Lipid panel   Thyroid Panel With TSH   VITAMIN D 25 Hydroxy (Vit-D Deficiency, Fractures)    Labs pending Health Maintenance reviewed Diet and exercise encouraged  Follow up plan: 6 months   Mary-Margaret Hassell Done, FNP

## 2020-08-31 ENCOUNTER — Telehealth: Payer: Self-pay | Admitting: Nurse Practitioner

## 2020-08-31 LAB — CMP14+EGFR
ALT: 8 IU/L (ref 0–32)
AST: 16 IU/L (ref 0–40)
Albumin/Globulin Ratio: 1.8 (ref 1.2–2.2)
Albumin: 4.4 g/dL (ref 3.8–4.8)
Alkaline Phosphatase: 60 IU/L (ref 44–121)
BUN/Creatinine Ratio: 13 (ref 12–28)
BUN: 13 mg/dL (ref 8–27)
Bilirubin Total: 0.8 mg/dL (ref 0.0–1.2)
CO2: 23 mmol/L (ref 20–29)
Calcium: 9.4 mg/dL (ref 8.7–10.3)
Chloride: 102 mmol/L (ref 96–106)
Creatinine, Ser: 0.98 mg/dL (ref 0.57–1.00)
Globulin, Total: 2.5 g/dL (ref 1.5–4.5)
Glucose: 94 mg/dL (ref 65–99)
Potassium: 4.2 mmol/L (ref 3.5–5.2)
Sodium: 141 mmol/L (ref 134–144)
Total Protein: 6.9 g/dL (ref 6.0–8.5)
eGFR: 63 mL/min/{1.73_m2} (ref >59)

## 2020-08-31 LAB — CBC WITH DIFFERENTIAL/PLATELET
Basophils Absolute: 0 10*3/uL (ref 0.0–0.2)
Basos: 1 %
EOS (ABSOLUTE): 0.1 10*3/uL (ref 0.0–0.4)
Eos: 1 %
Hematocrit: 35.5 % (ref 34.0–46.6)
Hemoglobin: 11.6 g/dL (ref 11.1–15.9)
Immature Grans (Abs): 0 10*3/uL (ref 0.0–0.1)
Immature Granulocytes: 0 %
Lymphocytes Absolute: 1.3 10*3/uL (ref 0.7–3.1)
Lymphs: 25 %
MCH: 31.4 pg (ref 26.6–33.0)
MCHC: 32.7 g/dL (ref 31.5–35.7)
MCV: 96 fL (ref 79–97)
Monocytes Absolute: 0.4 10*3/uL (ref 0.1–0.9)
Monocytes: 8 %
Neutrophils Absolute: 3.4 10*3/uL (ref 1.4–7.0)
Neutrophils: 65 %
Platelets: 225 10*3/uL (ref 150–450)
RBC: 3.69 x10E6/uL — ABNORMAL LOW (ref 3.77–5.28)
RDW: 11.9 % (ref 11.7–15.4)
WBC: 5.2 10*3/uL (ref 3.4–10.8)

## 2020-08-31 LAB — LIPID PANEL
Chol/HDL Ratio: 2.6 ratio (ref 0.0–4.4)
Cholesterol, Total: 188 mg/dL (ref 100–199)
HDL: 73 mg/dL (ref >39)
LDL Chol Calc (NIH): 103 mg/dL — ABNORMAL HIGH (ref 0–99)
Triglycerides: 67 mg/dL (ref 0–149)
VLDL Cholesterol Cal: 12 mg/dL (ref 5–40)

## 2020-08-31 LAB — VITAMIN D 25 HYDROXY (VIT D DEFICIENCY, FRACTURES): Vit D, 25-Hydroxy: 41.2 ng/mL (ref 30.0–100.0)

## 2020-08-31 LAB — THYROID PANEL WITH TSH
Free Thyroxine Index: 2.9 (ref 1.2–4.9)
T3 Uptake Ratio: 35 % (ref 24–39)
T4, Total: 8.3 ug/dL (ref 4.5–12.0)
TSH: 0.855 u[IU]/mL (ref 0.450–4.500)

## 2020-08-31 NOTE — Telephone Encounter (Signed)
Left message to call back  

## 2020-08-31 NOTE — Telephone Encounter (Signed)
Please advise 

## 2020-08-31 NOTE — Telephone Encounter (Signed)
She can wait and do in year if she wants

## 2020-09-03 NOTE — Telephone Encounter (Signed)
Left detailed message on patients answering machine 

## 2020-09-04 ENCOUNTER — Other Ambulatory Visit: Payer: Self-pay | Admitting: Nurse Practitioner

## 2020-09-04 DIAGNOSIS — Z1231 Encounter for screening mammogram for malignant neoplasm of breast: Secondary | ICD-10-CM

## 2020-12-10 ENCOUNTER — Telehealth: Payer: Self-pay | Admitting: Nurse Practitioner

## 2021-01-04 ENCOUNTER — Telehealth: Payer: Self-pay | Admitting: Nurse Practitioner

## 2021-01-10 ENCOUNTER — Other Ambulatory Visit: Payer: Self-pay

## 2021-01-10 DIAGNOSIS — Z1231 Encounter for screening mammogram for malignant neoplasm of breast: Secondary | ICD-10-CM

## 2021-01-11 ENCOUNTER — Ambulatory Visit (INDEPENDENT_AMBULATORY_CARE_PROVIDER_SITE_OTHER): Payer: Medicare HMO

## 2021-01-11 DIAGNOSIS — Z0000 Encounter for general adult medical examination without abnormal findings: Secondary | ICD-10-CM

## 2021-01-11 NOTE — Patient Instructions (Signed)
°  [facility] [facility] Maintenance Summary and Written Plan of Care  Ms. [name] ,  Thank you for allowing me to perform your Medicare Annual Wellness Visit and for your ongoing commitment to your health.   Health Maintenance & Immunization History Health Maintenance  Topic Date Due   COVID-19 Vaccine (1) Never done   INFLUENZA VACCINE  08/13/2020   Zoster Vaccines- Shingrix (2 of 2) 09/28/2020   MAMMOGRAM  10/11/2020   Fecal DNA (Cologuard)  02/25/2021   DEXA SCAN  08/31/2022   TETANUS/TDAP  10/08/2027   Pneumonia Vaccine 100+ Years old  Completed   Hepatitis C Screening  Completed   HPV VACCINES  Aged Out   Immunization History  Administered Date(s) Administered   Influenza Split 10/27/2018   Influenza, Quadrivalent, Recombinant, Inj, Pf 10/07/2017   Influenza,inj,Quad PF,6+ Mos 11/30/2012, 10/17/2013, 10/12/2014   Influenza-Unspecified 10/25/2015, 10/16/2016, 10/07/2017, 10/14/2019   Pneumococcal Conjugate-13 02/19/2018   Pneumococcal Polysaccharide-23 08/30/2019   Tdap 10/07/2017   Zoster Recombinat (Shingrix) 08/03/2020   Zoster, Live 09/09/2012    These are the patient goals that we discussed:  Goals Addressed   None      This is a list of Health Maintenance Items that are overdue or due now: Health Maintenance Due  Topic Date Due   COVID-19 Vaccine (1) Never done   INFLUENZA VACCINE  08/13/2020   Zoster Vaccines- Shingrix (2 of 2) 09/28/2020   MAMMOGRAM  10/11/2020     Orders/Referrals Placed Today: No orders of the defined types were placed in this encounter.  (Contact our referral department at [phone] if you have not spoken with someone about your referral appointment within the next 5 days)    Follow-up Plan  Scheduled with Shelah Lewandowsky, FNP on 03/05/2021 at 10:15am.

## 2021-01-11 NOTE — Progress Notes (Signed)
MEDICARE ANNUAL WELLNESS VISIT  01/11/2021  Telephone Visit Disclaimer This Medicare AWV was conducted by telephone due to national recommendations for restrictions regarding the COVID-19 Pandemic (e.g. social distancing).  I verified, using two identifiers, that I am speaking with [name] or their authorized healthcare agent. I discussed the limitations, risks, security, and privacy concerns of performing an evaluation and management service by telephone and the potential availability of an in-person appointment in the future. The patient expressed understanding and agreed to proceed.  Location of Patient: Home Location of Provider (nurse):  [facility]  Subjective:    [name] is a 70 y.o. female patient of [name], [name] who had a Medicare Annual Wellness Visit today via telephone. [name] is Retired and lives with their spouse. she has one child. she reports that she is socially active and does interact with friends/family regularly. she is minimally physically active and enjoys walking.  Patient Care Team: Joanna Pretty, FNP as PCP - General (Nurse Practitioner)  Advanced Directives 01/11/2021 02/12/2013 01/24/2013  Does Patient Have a Medical Advance Directive? No Patient does not have advance directive;Patient would not like information Patient does not have advance directive  Would patient like information on creating a medical advance directive? No - Patient declined - -  Pre-existing out of facility DNR order (yellow form or pink MOST form) - No -    Hospital Utilization Over the Past 12 Months: # of hospitalizations or ER visits: 0 # of surgeries: 0  Review of Systems    Patient reports that her overall health is unchanged compared to last year.  Negative except    Patient Reported Readings (BP, Pulse, CBG, Weight, etc) none  Pain Assessment Pain : No/denies pain     Current Medications & Allergies  (verified) Allergies as of 01/11/2021       Reactions   Asa [aspirin] Other (See Comments)   Contraindicated for hernia   Ciprofloxacin Hcl Other (See Comments)   Burning  both legs below knees as if on fire.   Septra Ds [sulfamethoxazole-trimethoprim]    Rapid HR, sweats , and low blood sugar   Tagamet [cimetidine] Rash        Medication List        Accurate as of January 11, 2021  3:21 PM. If you have any questions, ask your nurse or doctor.          STOP taking these medications    cetirizine 10 MG tablet Commonly known as: ZYRTEC        History (reviewed): Past Medical History:  Diagnosis Date   GAD (generalized anxiety disorder)    GAD (generalized anxiety disorder)    GERD (gastroesophageal reflux disease) 02/13/2013   Heart murmur    Hematuria    Lesion of bladder    Renal insufficiency 02/12/2013   Past Surgical History:  Procedure Laterality Date   APPENDECTOMY  03-09-2008   [place] W/ RETROGRADES Bilateral 01/24/2013   Procedure: CYSTOSCOPY WITH RETROGRADE PYELOGRAM;  Surgeon: Molli Hazard, MD;  Location: [facility];  Service: Urology;  Laterality: Bilateral;   CYSTOSCOPY WITH BIOPSY N/A 01/24/2013   Procedure: CYSTOSCOPY WITH BIOPSY;  Surgeon: Molli Hazard, MD;  Location: [facility];  Service: Urology;  Laterality: N/A;   LAPAROSCOPIC APPENDECTOMY N/A 2011   OPEN HIATAL HERNIA REPAIR  1985   TUBAL LIGATION  TUBAL LIGATION Bilateral    Family History  Problem Relation Age of Onset   COPD Mother    Cancer Father    Social History   Socioeconomic History   Marital status: Married    Spouse name: Not on file   Number of children: Not on file   Years of education: Not on file   Highest education level: Not on file  Occupational History   Not on file  Tobacco Use   Smoking status: Never   Smokeless tobacco: Never  Substance and Sexual  Activity   Alcohol use: No   Drug use: No   Sexual activity: Yes    Birth control/protection: Post-menopausal  Other Topics Concern   Not on file  Social History Narrative   Not on file   Social Determinants of Health   Financial Resource Strain: Not on file  Food Insecurity: Not on file  Transportation Needs: Not on file  Physical Activity: Not on file  Stress: Not on file  Social Connections: Not on file    Activities of Daily Living In your present state of health, do you have any difficulty performing the following activities: 01/11/2021  Hearing? N  Vision? N  Difficulty concentrating or making decisions? N  Walking or climbing stairs? N  Dressing or bathing? N  Doing errands, shopping? N  Preparing Food and eating ? N  Using the Toilet? N  In the past six months, have you accidently leaked urine? N  Do you have problems with loss of bowel control? N  Managing your Medications? N  Managing your Finances? N  Housekeeping or managing your Housekeeping? N  Some recent data might be hidden    Patient Education/ Literacy How often do you need to have someone help you when you read instructions, pamphlets, or other written materials from your doctor or pharmacy?: 1 - Never What is the last grade level you completed in school?: 12th  Exercise Current Exercise Habits: Home exercise routine, Type of exercise: walking, Time (Minutes): 60, Frequency (Times/Week): 7, Weekly Exercise (Minutes/Week): 420, Intensity: Mild  Diet Patient reports consuming 3 meals a day and 2 snack(s) a day Patient reports that her primary diet is: Regular Patient reports that she does have regular access to food.   Depression Screen PHQ 2/9 Scores 08/30/2020 07/23/2020 06/15/2020 01/20/2020 08/30/2019 05/06/2018 04/27/2018  PHQ - 2 Score 0 0 0 0 0 0 0  PHQ- 9 Score 0 - - - 0 - -     Fall Risk Fall Risk  08/30/2020 07/23/2020 06/15/2020 01/20/2020 08/30/2019  Falls in the past year? 0 0 0 0 0  Number  falls in past yr: - - - - -  Injury with Fall? - - - - -  Comment - - - - -     Objective:  [name] seemed alert and oriented and she participated appropriately during our telephone visit.  Blood Pressure Weight BMI  BP Readings from Last 3 Encounters:  08/30/20 119/60  07/23/20 (!) 120/58  06/15/20 124/62   Wt Readings from Last 3 Encounters:  08/30/20 126 lb (57.2 kg)  07/23/20 130 lb (59 kg)  06/15/20 132 lb 3.2 oz (60 kg)   BMI Readings from Last 1 Encounters:  08/30/20 20.97 kg/m    *Unable to obtain current vital signs, weight, and BMI due to telephone visit type  Hearing/Vision  [name] did not seem to have difficulty with hearing/understanding during the telephone conversation Reports that she has not  had a formal eye exam by an eye care professional within the past year Reports that she has not had a formal hearing evaluation within the past year *Unable to fully assess hearing and vision during telephone visit type  Cognitive Function: 6CIT Screen 01/11/2021  What Year? 0 points  What month? 0 points  What time? 0 points  Count back from 20 0 points  Months in reverse 0 points  Repeat phrase 0 points  Total Score 0   (Normal:0-7, Significant for Dysfunction: >8)  Normal Cognitive Function Screening: Yes   Immunization & Health Maintenance Record Immunization History  Administered Date(s) Administered   Influenza Split 10/27/2018   Influenza, Quadrivalent, Recombinant, Inj, Pf 10/07/2017   Influenza,inj,Quad PF,6+ Mos 11/30/2012, 10/17/2013, 10/12/2014   Influenza-Unspecified 10/25/2015, 10/16/2016, 10/07/2017, 10/14/2019   Pneumococcal Conjugate-13 02/19/2018   Pneumococcal Polysaccharide-23 08/30/2019   Tdap 10/07/2017   Zoster Recombinat (Shingrix) 08/03/2020   Zoster, Live 09/09/2012    Health Maintenance  Topic Date Due   COVID-19 Vaccine (1) Never done   INFLUENZA VACCINE  08/13/2020   Zoster Vaccines- Shingrix (2 of 2) 09/28/2020    MAMMOGRAM  10/11/2020   Fecal DNA (Cologuard)  02/25/2021   DEXA SCAN  08/31/2022   TETANUS/TDAP  10/08/2027   Pneumonia Vaccine 40+ Years old  Completed   Hepatitis C Screening  Completed   HPV VACCINES  Aged Out       Assessment  This is a routine wellness examination for [employer].  Health Maintenance: Due or Overdue Health Maintenance Due  Topic Date Due   COVID-19 Vaccine (1) Never done   INFLUENZA VACCINE  08/13/2020   Zoster Vaccines- Shingrix (2 of 2) 09/28/2020   MAMMOGRAM  10/11/2020    [name] does not need a referral for Community Assistance: Care Management:   no Social Work:    no Prescription Assistance:  no Nutrition/Diabetes Education:  no   Plan:  Personalized Goals  Goals Addressed   None    Personalized Health Maintenance & Screening Recommendations  Influenza vaccine Covid vaccine Shingles vaccine Mammogram  Lung Cancer Screening Recommended: no (Low Dose CT Chest recommended if Age 60-80 years, 30 pack-year currently smoking OR have quit w/in past 15 years) Hepatitis C Screening recommended: no HIV Screening recommended: no  Advanced Directives: Written information was not prepared per patient's request.  Referrals & Orders No orders of the defined types were placed in this encounter.   Follow-up Plan Follow-up with Joanna Pretty, FNP as planned Schedule 03/05/2021    I have personally reviewed and noted the following in the patients chart:   Medical and social history Use of alcohol, tobacco or illicit drugs  Current medications and supplements Functional ability and status Nutritional status Physical activity Advanced directives List of other physicians Hospitalizations, surgeries, and ER visits in previous 12 months Vitals Screenings to include cognitive, depression, and falls Referrals and appointments  In addition, I have reviewed and discussed with [name] certain preventive protocols, quality  metrics, and best practice recommendations. A written personalized care plan for preventive services as well as general preventive health recommendations is available and can be mailed to the patient at her request.      Maud Deed [facility]  [id number]

## 2021-02-18 ENCOUNTER — Ambulatory Visit
Admission: RE | Admit: 2021-02-18 | Discharge: 2021-02-18 | Disposition: A | Discharge status: Home / Self Care | Payer: Medicare HMO | Source: Ambulatory Visit | Attending: Nurse Practitioner | Admitting: Nurse Practitioner

## 2021-02-18 DIAGNOSIS — Z1231 Encounter for screening mammogram for malignant neoplasm of breast: Secondary | ICD-10-CM

## 2021-03-04 ENCOUNTER — Ambulatory Visit: Payer: Medicare HMO | Admitting: Nurse Practitioner

## 2021-03-05 ENCOUNTER — Ambulatory Visit: Payer: Medicare HMO | Admitting: Nurse Practitioner

## 2021-03-29 ENCOUNTER — Ambulatory Visit (INDEPENDENT_AMBULATORY_CARE_PROVIDER_SITE_OTHER): Payer: Medicare HMO | Admitting: Nurse Practitioner

## 2021-03-29 ENCOUNTER — Encounter: Payer: Self-pay | Admitting: Nurse Practitioner

## 2021-03-29 VITALS — BP 107/73 | HR 82 | Temp 97.2°F | Resp 20 | Ht 65.0 in | Wt 133.0 lb

## 2021-03-29 DIAGNOSIS — M8588 Other specified disorders of bone density and structure, other site: Secondary | ICD-10-CM

## 2021-03-29 DIAGNOSIS — F411 Generalized anxiety disorder: Secondary | ICD-10-CM

## 2021-03-29 DIAGNOSIS — R69 Illness, unspecified: Secondary | ICD-10-CM | POA: Diagnosis not present

## 2021-03-29 DIAGNOSIS — K219 Gastro-esophageal reflux disease without esophagitis: Secondary | ICD-10-CM

## 2021-03-29 NOTE — Patient Instructions (Signed)
Exercising to Stay Healthy °To become healthy and stay healthy, it is recommended that you do moderate-intensity and vigorous-intensity exercise. You can tell that you are exercising at a moderate intensity if your heart starts beating faster and you start breathing faster but can still hold a conversation. You can tell that you are exercising at a vigorous intensity if you are breathing much harder and faster and cannot hold a conversation while exercising. °How can exercise benefit me? °Exercising regularly is important. It has many health benefits, such as: °Improving overall fitness, flexibility, and endurance. °Increasing bone density. °Helping with weight control. °Decreasing body fat. °Increasing muscle strength and endurance. °Reducing stress and tension, anxiety, depression, or anger. °Improving overall health. °What guidelines should I follow while exercising? °Before you start a new exercise program, talk with your health care provider. °Do not exercise so much that you hurt yourself, feel dizzy, or get very short of breath. °Wear comfortable clothes and wear shoes with good support. °Drink plenty of water while you exercise to prevent dehydration or heat stroke. °Work out until your breathing and your heartbeat get faster (moderate intensity). °How often should I exercise? °Choose an activity that you enjoy, and set realistic goals. Your health care provider can help you make an activity plan that is individually designed and works best for you. °Exercise regularly as told by your health care provider. This may include: °Doing strength training two times a week, such as: °Lifting weights. °Using resistance bands. °Push-ups. °Sit-ups. °Yoga. °Doing a certain intensity of exercise for a given amount of time. Choose from these options: °A total of 150 minutes of moderate-intensity exercise every week. °A total of 75 minutes of vigorous-intensity exercise every week. °A mix of moderate-intensity and  vigorous-intensity exercise every week. °Children, pregnant women, people who have not exercised regularly, people who are overweight, and older adults may need to talk with a health care provider about what activities are safe to perform. If you have a medical condition, be sure to talk with your health care provider before you start a new exercise program. °What are some exercise ideas? °Moderate-intensity exercise ideas include: °Walking 1 mile (1.6 km) in about 15 minutes. °Biking. °Hiking. °Golfing. °Dancing. °Water aerobics. °Vigorous-intensity exercise ideas include: °Walking 4.5 miles (7.2 km) or more in about 1 hour. °Jogging or running 5 miles (8 km) in about 1 hour. °Biking 10 miles (16.1 km) or more in about 1 hour. °Lap swimming. °Roller-skating or in-line skating. °Cross-country skiing. °Vigorous competitive sports, such as football, basketball, and soccer. °Jumping rope. °Aerobic dancing. °What are some everyday activities that can help me get exercise? °Yard work, such as: °Pushing a lawn mower. °Raking and bagging leaves. °Washing your car. °Pushing a stroller. °Shoveling snow. °Gardening. °Washing windows or floors. °How can I be more active in my day-to-day activities? °Use stairs instead of an elevator. °Take a walk during your lunch break. °If you drive, park your car farther away from your work or school. °If you take public transportation, get off one stop early and walk the rest of the way. °Stand up or walk around during all of your indoor phone calls. °Get up, stretch, and walk around every 30 minutes throughout the day. °Enjoy exercise with a friend. Support to continue exercising will help you keep a regular routine of activity. °Where to find more information °You can find more information about exercising to stay healthy from: °[place] Department of Health and Human Services: www.hhs.gov °Centers for Disease Control and Prevention (  CDC): www.cdc.gov °Summary °Exercising regularly is  important. It will improve your overall fitness, flexibility, and endurance. °Regular exercise will also improve your overall health. It can help you control your weight, reduce stress, and improve your bone density. °Do not exercise so much that you hurt yourself, feel dizzy, or get very short of breath. °Before you start a new exercise program, talk with your health care provider. °This information is not intended to replace advice given to you by your health care provider. Make sure you discuss any questions you have with your health care provider. °Document Revised: 04/27/2020 Document Reviewed: 04/27/2020 °Elsevier Patient Education © 2022 Elsevier Inc. ° °

## 2021-03-29 NOTE — Progress Notes (Signed)
? ?Subjective:  ? ? Patient ID: [name], female    DOB: December 07, 1950, 71 y.o.   MRN: [id number] ? ? ?Chief Complaint: Medical Management of Chronic Issues ?  ? ?HPI: ? ?[name] is a 71 y.o. who identifies as a female who was assigned female at birth.  ? 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 ? ?Comes in today for follow up of the following chronic medical issues: ? ?1. Gastroesophageal reflux disease without esophagitis ?Is on no prescription meds and is doing well ? ?2. GAD (generalized anxiety disorder) ?Has some moments of anxiety but is on no meds. ?GAD 7 : Generalized Anxiety Score 03/29/2021 08/30/2020 06/15/2020  ?Nervous, Anxious, on Edge 0 0 0  ?Control/stop worrying 0 0 0  ?Worry too much - different things 0 0 0  ?Trouble relaxing 0 0 0  ?Restless 0 0 0  ?Easily annoyed or irritable 0 0 0  ?Afraid - awful might happen 0 0 0  ?Total GAD 7 Score 0 0 0  ?Anxiety Difficulty Not difficult at all Not difficult at all Not difficult at all  ? ? ? ? ?3. Osteopenia of lumbar spine ?Last dexascan was done on 08/31/20. T score was -1.4. she does a lot of walking at home. ? ? ?New complaints: ?None today ? ?Allergies  ?Allergen Reactions  ? Asa [Aspirin] Other (See Comments)  ?  Contraindicated for hernia  ? Ciprofloxacin Hcl Other (See Comments)  ?  Burning  both legs below knees as if on fire.  ? Septra Ds [Sulfamethoxazole-Trimethoprim]   ?  Rapid HR, sweats , and low blood sugar  ? Tagamet [Cimetidine] Rash  ? ?No outpatient encounter medications on file as of 03/29/2021.  ? ?No facility-administered encounter medications on file as of 03/29/2021.  ? ? ?Past Surgical History:  ?Procedure Laterality Date  ? APPENDECTOMY  03-09-2008  ? CHOLECYSTECTOMY  1990  ? CHOLECYSTECTOMY    ? CYSTOSCOPY W/ RETROGRADES Bilateral 01/24/2013  ? Procedure: CYSTOSCOPY WITH RETROGRADE PYELOGRAM;  Surgeon: Molli Hazard, MD;  Location: [facility];  Service: Urology;   Laterality: Bilateral;  ? CYSTOSCOPY WITH BIOPSY N/A 01/24/2013  ? Procedure: CYSTOSCOPY WITH BIOPSY;  Surgeon: Molli Hazard, MD;  Location: [facility];  Service: Urology;  Laterality: N/A;  ? LAPAROSCOPIC APPENDECTOMY N/A 2011  ? OPEN HIATAL HERNIA REPAIR  1985  ? TUBAL LIGATION    ? TUBAL LIGATION Bilateral   ? ? ?Family History  ?Problem Relation Age of Onset  ? COPD Mother   ? Cancer Father   ? Breast cancer Neg Hx   ? ? ? ? ?Controlled substance contract: n/a ? ? ? ? ?Review of Systems  ?Constitutional:  Negative for diaphoresis.  ?Eyes:  Negative for pain.  ?Respiratory:  Negative for shortness of breath.   ?Cardiovascular:  Negative for chest pain, palpitations and leg swelling.  ?Gastrointestinal:  Negative for abdominal pain.  ?Endocrine: Negative for polydipsia.  ?Skin:  Negative for rash.  ?Neurological:  Negative for dizziness, weakness and headaches.  ?Hematological:  Does not bruise/bleed easily.  ?All other systems reviewed and are negative. ? ?   ?Objective:  ? Physical Exam ?Vitals and nursing note reviewed.  ?Constitutional:   ?   General: She is not in acute distress. ?   Appearance: Normal appearance. She is well-developed.  ?HENT:  ?   Head: Normocephalic.  ?   Right Ear: Tympanic membrane  normal.  ?   Left Ear: Tympanic membrane normal.  ?   Nose: Nose normal.  ?   Mouth/Throat:  ?   Mouth: Mucous membranes are moist.  ?Eyes:  ?   Pupils: Pupils are equal, round, and reactive to light.  ?Neck:  ?   Vascular: No carotid bruit or JVD.  ?Cardiovascular:  ?   Rate and Rhythm: Normal rate and regular rhythm.  ?   Heart sounds: Normal heart sounds.  ?Pulmonary:  ?   Effort: Pulmonary effort is normal. No respiratory distress.  ?   Breath sounds: Normal breath sounds. No wheezing or rales.  ?Chest:  ?   Chest wall: No tenderness.  ?Abdominal:  ?   General: Bowel sounds are normal. There is no distension or abdominal bruit.  ?   Palpations: Abdomen is soft. There is no  hepatomegaly, splenomegaly, mass or pulsatile mass.  ?   Tenderness: There is no abdominal tenderness.  ?Musculoskeletal:     ?   General: Normal range of motion.  ?   Cervical back: Normal range of motion and neck supple.  ?Lymphadenopathy:  ?   Cervical: No cervical adenopathy.  ?Skin: ?   General: Skin is warm and dry.  ?Neurological:  ?   Mental Status: She is alert and oriented to person, place, and time.  ?   Deep Tendon Reflexes: Reflexes are normal and symmetric.  ?Psychiatric:     ?   Behavior: Behavior normal.     ?   Thought Content: Thought content normal.     ?   Judgment: Judgment normal.  ? ? ? ?BP 107/73   Pulse 82   Temp (!) 97.2 ?F (36.2 ?C) (Temporal)   Resp 20   Ht '5\' 5"'$  (1.651 m)   Wt 133 lb (60.3 kg)   SpO2 98%   BMI 22.13 kg/m?  ? ? ?   ?Assessment & Plan:  ? ?[name] comes in today with chief complaint of Medical Management of Chronic Issues ? ? ?Diagnosis and orders addressed: ? ?1. Gastroesophageal reflux disease without esophagitis ?Avoid spicy foods ?Do not eat 2 hours prior to bedtime ? ? ?2. GAD (generalized anxiety disorder) ?Stress management ?Deep breathing exrcises ? ?3. Osteopenia of lumbar spine ?Weight bearing exercises ? ? ?Health Maintenance reviewed ?Diet and exercise encouraged ? ?Follow up plan: ?1 year ? ? ?Mary-Margaret Hassell Done, FNP ? ?

## 2021-09-24 DIAGNOSIS — L570 Actinic keratosis: Secondary | ICD-10-CM | POA: Diagnosis not present

## 2021-09-24 DIAGNOSIS — L821 Other seborrheic keratosis: Secondary | ICD-10-CM | POA: Diagnosis not present

## 2021-09-24 DIAGNOSIS — L578 Other skin changes due to chronic exposure to nonionizing radiation: Secondary | ICD-10-CM | POA: Diagnosis not present

## 2021-09-24 DIAGNOSIS — D692 Other nonthrombocytopenic purpura: Secondary | ICD-10-CM | POA: Diagnosis not present

## 2022-01-21 ENCOUNTER — Ambulatory Visit (INDEPENDENT_AMBULATORY_CARE_PROVIDER_SITE_OTHER): Payer: Medicare HMO

## 2022-01-21 VITALS — Ht 64.0 in | Wt 135.0 lb

## 2022-01-21 DIAGNOSIS — Z0000 Encounter for general adult medical examination without abnormal findings: Secondary | ICD-10-CM

## 2022-01-21 DIAGNOSIS — Z1211 Encounter for screening for malignant neoplasm of colon: Secondary | ICD-10-CM

## 2022-01-21 NOTE — Patient Instructions (Signed)
[name] , Thank you for taking time to come for your Medicare Wellness Visit. I appreciate your ongoing commitment to your health goals. Please review the following plan we discussed and let me know if I can assist you in the future.   These are the goals we discussed:  Goals      DIET - INCREASE WATER INTAKE        This is a list of the screening recommended for you and due dates:  Health Maintenance  Topic Date Due   COVID-19 Vaccine (1) Never done   Cologuard (Stool DNA test)  02/25/2021   Flu Shot  08/13/2021   Mammogram  02/18/2022   DEXA scan (bone density measurement)  08/31/2022   Medicare Annual Wellness Visit  01/22/2023   DTaP/Tdap/Td vaccine (2 - Td or Tdap) 10/08/2027   Pneumonia Vaccine  Completed   Hepatitis C Screening: USPSTF Recommendation to screen - Ages 24-79 yo.  Completed   Zoster (Shingles) Vaccine  Completed   HPV Vaccine  Aged Out    Advanced directives: Advance directive discussed with you today. I have provided a copy for you to complete at home and have notarized. Once this is complete please bring a copy in to our office so we can scan it into your chart.   Conditions/risks identified: Aim for 30 minutes of exercise or brisk walking, 6-8 glasses of water, and 5 servings of fruits and vegetables each day.   Next appointment: Follow up in one year for your annual wellness visit    Preventive Care 65 Years and Older, Female Preventive care refers to lifestyle choices and visits with your health care provider that can promote health and wellness. What does preventive care include? A yearly physical exam. This is also called an annual well check. Dental exams once or twice a year. Routine eye exams. Ask your health care provider how often you should have your eyes checked. Personal lifestyle choices, including: Daily care of your teeth and gums. Regular physical activity. Eating a healthy diet. Avoiding tobacco and drug use. Limiting alcohol  use. Practicing safe sex. Taking low-dose aspirin every day. Taking vitamin and mineral supplements as recommended by your health care provider. What happens during an annual well check? The services and screenings done by your health care provider during your annual well check will depend on your age, overall health, lifestyle risk factors, and family history of disease. Counseling  Your health care provider may ask you questions about your: Alcohol use. Tobacco use. Drug use. Emotional well-being. Home and relationship well-being. Sexual activity. Eating habits. History of falls. Memory and ability to understand (cognition). Work and work Statistician. Reproductive health. Screening  You may have the following tests or measurements: Height, weight, and BMI. Blood pressure. Lipid and cholesterol levels. These may be checked every 5 years, or more frequently if you are over 69 years old. Skin check. Lung cancer screening. You may have this screening every year starting at age 41 if you have a 30-pack-year history of smoking and currently smoke or have quit within the past 15 years. Fecal occult blood test (FOBT) of the stool. You may have this test every year starting at age 41. Flexible sigmoidoscopy or colonoscopy. You may have a sigmoidoscopy every 5 years or a colonoscopy every 10 years starting at age 75. Hepatitis C blood test. Hepatitis B blood test. Sexually transmitted disease (STD) testing. Diabetes screening. This is done by checking your blood sugar (glucose) after you have not  eaten for a while (fasting). You may have this done every 1-3 years. Bone density scan. This is done to screen for osteoporosis. You may have this done starting at age 62. Mammogram. This may be done every 1-2 years. Talk to your health care provider about how often you should have regular mammograms. Talk with your health care provider about your test results, treatment options, and if necessary,  the need for more tests. Vaccines  Your health care provider may recommend certain vaccines, such as: Influenza vaccine. This is recommended every year. Tetanus, diphtheria, and acellular pertussis (Tdap, Td) vaccine. You may need a Td booster every 10 years. Zoster vaccine. You may need this after age 81. Pneumococcal 13-valent conjugate (PCV13) vaccine. One dose is recommended after age 67. Pneumococcal polysaccharide (PPSV23) vaccine. One dose is recommended after age 20. Talk to your health care provider about which screenings and vaccines you need and how often you need them. This information is not intended to replace advice given to you by your health care provider. Make sure you discuss any questions you have with your health care provider. Document Released: 01/26/2015 Document Revised: 09/19/2015 Document Reviewed: 10/31/2014 Elsevier Interactive Patient Education  2017 [employer] Prevention in the Home Falls can cause injuries. They can happen to people of all ages. There are many things you can do to make your home safe and to help prevent falls. What can I do on the outside of my home? Regularly fix the edges of walkways and driveways and fix any cracks. Remove anything that might make you trip as you walk through a door, such as a raised step or threshold. Trim any bushes or trees on the path to your home. Use bright outdoor lighting. Clear any walking paths of anything that might make someone trip, such as rocks or tools. Regularly check to see if handrails are loose or broken. Make sure that both sides of any steps have handrails. Any raised decks and porches should have guardrails on the edges. Have any leaves, snow, or ice cleared regularly. Use sand or salt on walking paths during winter. Clean up any spills in your garage right away. This includes oil or grease spills. What can I do in the bathroom? Use night lights. Install grab bars by the toilet and in the  tub and shower. Do not use towel bars as grab bars. Use non-skid mats or decals in the tub or shower. If you need to sit down in the shower, use a plastic, non-slip stool. Keep the floor dry. Clean up any water that spills on the floor as soon as it happens. Remove soap buildup in the tub or shower regularly. Attach bath mats securely with double-sided non-slip rug tape. Do not have throw rugs and other things on the floor that can make you trip. What can I do in the bedroom? Use night lights. Make sure that you have a light by your bed that is easy to reach. Do not use any sheets or blankets that are too big for your bed. They should not hang down onto the floor. Have a firm chair that has side arms. You can use this for support while you get dressed. Do not have throw rugs and other things on the floor that can make you trip. What can I do in the kitchen? Clean up any spills right away. Avoid walking on wet floors. Keep items that you use a lot in easy-to-reach places. If you need to reach  something above you, use a strong step stool that has a grab bar. Keep electrical cords out of the way. Do not use floor polish or wax that makes floors slippery. If you must use wax, use non-skid floor wax. Do not have throw rugs and other things on the floor that can make you trip. What can I do with my stairs? Do not leave any items on the stairs. Make sure that there are handrails on both sides of the stairs and use them. Fix handrails that are broken or loose. Make sure that handrails are as long as the stairways. Check any carpeting to make sure that it is firmly attached to the stairs. Fix any carpet that is loose or worn. Avoid having throw rugs at the top or bottom of the stairs. If you do have throw rugs, attach them to the floor with carpet tape. Make sure that you have a light switch at the top of the stairs and the bottom of the stairs. If you do not have them, ask someone to add them for  you. What else can I do to help prevent falls? Wear shoes that: Do not have high heels. Have rubber bottoms. Are comfortable and fit you well. Are closed at the toe. Do not wear sandals. If you use a stepladder: Make sure that it is fully opened. Do not climb a closed stepladder. Make sure that both sides of the stepladder are locked into place. Ask someone to hold it for you, if possible. Clearly mark and make sure that you can see: Any grab bars or handrails. First and last steps. Where the edge of each step is. Use tools that help you move around (mobility aids) if they are needed. These include: Canes. Walkers. Scooters. Crutches. Turn on the lights when you go into a dark area. Replace any light bulbs as soon as they burn out. Set up your furniture so you have a clear path. Avoid moving your furniture around. If any of your floors are uneven, fix them. If there are any pets around you, be aware of where they are. Review your medicines with your doctor. Some medicines can make you feel dizzy. This can increase your chance of falling. Ask your doctor what other things that you can do to help prevent falls. This information is not intended to replace advice given to you by your health care provider. Make sure you discuss any questions you have with your health care provider. Document Released: 10/26/2008 Document Revised: 06/07/2015 Document Reviewed: 02/03/2014 Elsevier Interactive Patient Education  2017 [employer].

## 2022-01-21 NOTE — Progress Notes (Signed)
Subjective:   JALEEAH Dixon is a 72 y.o. female who presents for Medicare Annual (Subsequent) preventive examination. I connected with  [name] on 01/21/22 by a audio enabled telemedicine application and verified that I am speaking with the correct person using two identifiers.  Patient Location: Home  Provider Location: Home Office  I discussed the limitations of evaluation and management by telemedicine. The patient expressed understanding and agreed to proceed.  Review of Systems     Cardiac Risk Factors include: advanced age (>50mn, >>77women)     Objective:    Today's Vitals   01/21/22 1527  Weight: 135 lb (61.2 kg)  Height: '5\' 4"'$  (1.626 m)   Body mass index is 23.17 kg/m.     01/21/2022    3:30 PM 01/11/2021    3:14 PM 02/12/2013    5:00 AM 01/24/2013   11:10 AM  Advanced Directives  Does Patient Have a Medical Advance Directive? No No Patient does not have advance directive;Patient would not like information Patient does not have advance directive  Would patient like information on creating a medical advance directive? No - Patient declined No - Patient declined    Pre-existing out of facility DNR order (yellow form or pink MOST form)   No     Current Medications (verified) No outpatient encounter medications on file as of 01/21/2022.   No facility-administered encounter medications on file as of 01/21/2022.    Allergies (verified) Asa [aspirin], Ciprofloxacin hcl, Septra ds [sulfamethoxazole-trimethoprim], and Tagamet [cimetidine]   History: Past Medical History:  Diagnosis Date   GAD (generalized anxiety disorder)    GAD (generalized anxiety disorder)    GERD (gastroesophageal reflux disease) 02/13/2013   Heart murmur    Hematuria    Lesion of bladder    Renal insufficiency 02/12/2013   Past Surgical History:  Procedure Laterality Date   APPENDECTOMY  03-09-2008   [place] Bilateral  01/24/2013   Procedure: CYSTOSCOPY WITH RETROGRADE PYELOGRAM;  Surgeon: DMolli Hazard MD;  [facility]  [facility]rology;  Laterality: Bilateral;   CYSTOSCOPY WITH BIOPSY N/A 01/24/2013   Procedure: CYSTOSCOPY WITH BIOPSY;  Surgeon: DMolli Hazard MD;  [facility]  Service: Urology;  Laterality: N/A;   LAPAROSCOPIC APPENDECTOMY N/A 2011   OPEN HIATAL HERNIA REPAIR  1985   TUBAL LIGATION     TUBAL LIGATION Bilateral    Family History  Problem Relation Age of Onset   COPD Mother    Cancer Father    Breast cancer Neg Hx    Social History   Socioeconomic History   Marital status: Married    Spouse name: Not on file   Number of children: Not on file   Years of education: Not on file   Highest education level: Not on file  Occupational History   Not on file  Tobacco Use   Smoking status: Never   Smokeless tobacco: Never  Substance and Sexual Activity   Alcohol use: No   Drug use: No   Sexual activity: Yes    Birth control/protection: Post-menopausal  Other Topics Concern   Not on file  Social History Narrative   Not on file   Social Determinants of Health   Financial Resource Strain: Low Risk  (01/21/2022)   Overall Financial Resource Strain (CARDIA)    Difficulty of Paying Living Expenses: Not hard at all  Food Insecurity:  No Food Insecurity (01/21/2022)   Hunger Vital Sign    Worried About Running Out of Food in the Last Year: Never true    Ran Out of Food in the Last Year: Never true  Transportation Needs: No Transportation Needs (01/21/2022)   PRAPARE - Hydrologist (Medical): No    Lack of Transportation (Non-Medical): No  Physical Activity: Sufficiently Active (01/21/2022)   Exercise Vital Sign    Days of Exercise per Week: 3 days    Minutes of Exercise per Session: 60 min  Stress: No Stress Concern Present (01/21/2022)   [place]    Feeling of Stress : Not at all  Social Connections: [place] (01/21/2022)   Social Connection and Isolation Panel [NHANES]    Frequency of Communication with Friends and Family: More than three times a week    Frequency of Social Gatherings with Friends and Family: More than three times a week    Attends Religious Services: More than 4 times per year    Active Member of [employer] or Organizations: Yes    Attends Music therapist: More than 4 times per year    Marital Status: Married    Tobacco Counseling Counseling given: Not Answered   Clinical Intake:  Pre-visit preparation completed: Yes  Pain : No/denies pain     Nutritional Risks: None Diabetes: No  How often do you need to have someone help you when you read instructions, pamphlets, or other written materials from your doctor or pharmacy?: 1 - Never  Diabetic?no   Interpreter Needed?: No  Information entered by :: Jadene Pierini, LPN   Activities of Daily Living    01/21/2022    3:30 PM  In your present state of health, do you have any difficulty performing the following activities:  Hearing? 0  Vision? 0  Difficulty concentrating or making decisions? 0  Walking or climbing stairs? 0  Dressing or bathing? 0  Doing errands, shopping? 0  Preparing Food and eating ? N  Using the Toilet? N  In the past six months, have you accidently leaked urine? N  Do you have problems with loss of bowel control? N  Managing your Medications? N  Managing your Finances? N  Housekeeping or managing your Housekeeping? N    Patient Care Team: Chevis Pretty, FNP as PCP - General (Nurse Practitioner)  Indicate any recent Medical Services you may have received from other than [facility] providers in the past year (date may be approximate).     Assessment:   This is a routine wellness examination for [place].  Hearing/Vision screen Vision Screening - Comments:: Wears rx glasses - up  to date with routine eye exams with  Dr.Johnson   Dietary issues and exercise activities discussed:     Goals Addressed             This Visit's Progress    DIET - INCREASE WATER INTAKE         Depression Screen    01/21/2022    3:29 PM 03/29/2021   11:56 AM 08/30/2020    9:35 AM 07/23/2020    2:54 PM 06/15/2020    1:53 PM 01/20/2020   11:41 AM 08/30/2019    9:25 AM  PHQ 2/9 Scores  PHQ - 2 Score 0 0 0 0 0 0 0  PHQ- 9 Score  0 0    0    Fall Risk  01/21/2022    3:28 PM 03/29/2021   11:56 AM 08/30/2020    9:35 AM 07/23/2020    2:54 PM 06/15/2020    1:52 PM  Fall Risk   Falls in the past year? 0 0 0 0 0  Number falls in past yr: 0      Injury with Fall? 0      Risk for fall due to : No Fall Risks      Follow up Falls prevention discussed        [place]:  Any stairs in or around the home? Yes  If so, are there any without handrails? No  Home free of loose throw rugs in walkways, pet beds, electrical cords, etc? Yes  Adequate lighting in your home to reduce risk of falls? Yes   ASSISTIVE DEVICES UTILIZED TO PREVENT FALLS:  Life alert? No  Use of a cane, walker or w/c? No  Grab bars in the bathroom? No  Shower chair or bench in shower? No  Elevated toilet seat or a handicapped toilet? No          01/21/2022    3:30 PM 01/11/2021    3:16 PM  6CIT Screen  What Year? 0 points 0 points  What month? 0 points 0 points  What time? 0 points 0 points  Count back from 20 0 points 0 points  Months in reverse 0 points 0 points  Repeat phrase 0 points 0 points  Total Score 0 points 0 points    Immunizations Immunization History  Administered Date(s) Administered   Influenza Split 10/27/2018   Influenza, Quadrivalent, Recombinant, Inj, Pf 10/07/2017   Influenza,inj,Quad PF,6+ Mos 11/30/2012, 10/17/2013, 10/12/2014   Influenza-Unspecified 10/25/2015, 10/16/2016, 10/07/2017, 10/14/2019   Pneumococcal Conjugate-13 02/19/2018    Pneumococcal Polysaccharide-23 08/30/2019   Tdap 10/07/2017   Zoster Recombinat (Shingrix) 08/03/2020, 10/12/2020   Zoster, Live 09/09/2012    TDAP status: Up to date  Flu Vaccine status: Up to date  Pneumococcal vaccine status: Up to date  Covid-19 vaccine status: Completed vaccines  Qualifies for Shingles Vaccine? Yes   Zostavax completed Yes   Shingrix Completed?: Yes  Screening Tests Health Maintenance  Topic Date Due   COVID-19 Vaccine (1) Never done   Fecal DNA (Cologuard)  02/25/2021   INFLUENZA VACCINE  08/13/2021   MAMMOGRAM  02/18/2022   DEXA SCAN  08/31/2022   Medicare Annual Wellness (AWV)  01/22/2023   DTaP/Tdap/Td (2 - Td or Tdap) 10/08/2027   Pneumonia Vaccine 55+ Years old  Completed   Hepatitis C Screening  Completed   Zoster Vaccines- Shingrix  Completed   HPV VACCINES  Aged Out    Health Maintenance  Health Maintenance Due  Topic Date Due   COVID-19 Vaccine (1) Never done   Fecal DNA (Cologuard)  02/25/2021   INFLUENZA VACCINE  08/13/2021    Colorectal cancer screening: Referral to GI placed 01/21/2022. Pt aware the office will call re: appt.  Mammogram status: Completed 02/18/2021. Repeat every year  Bone Density status: Completed 08/30/2020. Results reflect: Bone density results: OSTEOPENIA. Repeat every 5 years.  Lung Cancer Screening: (Low Dose CT Chest recommended if Age 32-80 years, 30 pack-year currently smoking OR have quit w/in 15years.) does not qualify.   Lung Cancer Screening Referral: n/a  Additional Screening:  Hepatitis C Screening: does not qualify;   Vision Screening: Recommended annual ophthalmology exams for early detection of glaucoma and other disorders of the eye. Is the patient  up to date with their annual eye exam?  Yes  Who is the provider or what is the name of the office in which the patient attends annual eye exams? Dr.johnson  If pt is not established with a provider, would they like to be referred to a  provider to establish care? No .   Dental Screening: Recommended annual dental exams for proper oral hygiene  Community Resource Referral / Chronic Care Management: CRR required this visit?  No   CCM required this visit?  No      Plan:     I have personally reviewed and noted the following in the patient's chart:   Medical and social history Use of alcohol, tobacco or illicit drugs  Current medications and supplements including opioid prescriptions. Patient is not currently taking opioid prescriptions. Functional ability and status Nutritional status Physical activity Advanced directives List of other physicians Hospitalizations, surgeries, and ER visits in previous 12 months Vitals Screenings to include cognitive, depression, and falls Referrals and appointments  In addition, I have reviewed and discussed with patient certain preventive protocols, quality metrics, and best practice recommendations. A written personalized care plan for preventive services as well as general preventive health recommendations were provided to patient.     Daphane Shepherd, LPN   06/17/7844   Nurse Notes: Due Cologuard

## 2022-02-06 DIAGNOSIS — H35361 Drusen (degenerative) of macula, right eye: Secondary | ICD-10-CM | POA: Diagnosis not present

## 2022-02-06 DIAGNOSIS — H524 Presbyopia: Secondary | ICD-10-CM | POA: Diagnosis not present

## 2022-02-06 DIAGNOSIS — H5203 Hypermetropia, bilateral: Secondary | ICD-10-CM | POA: Diagnosis not present

## 2022-02-06 DIAGNOSIS — H53022 Refractive amblyopia, left eye: Secondary | ICD-10-CM | POA: Diagnosis not present

## 2022-02-06 DIAGNOSIS — H52223 Regular astigmatism, bilateral: Secondary | ICD-10-CM | POA: Diagnosis not present

## 2022-02-06 DIAGNOSIS — H2513 Age-related nuclear cataract, bilateral: Secondary | ICD-10-CM | POA: Diagnosis not present

## 2022-03-06 ENCOUNTER — Ambulatory Visit
Admission: EM | Admit: 2022-03-06 | Discharge: 2022-03-06 | Disposition: A | Discharge status: Home / Self Care | Payer: Medicare HMO | Attending: Nurse Practitioner | Admitting: Nurse Practitioner

## 2022-03-06 DIAGNOSIS — R300 Dysuria: Secondary | ICD-10-CM | POA: Insufficient documentation

## 2022-03-06 DIAGNOSIS — J069 Acute upper respiratory infection, unspecified: Secondary | ICD-10-CM

## 2022-03-06 DIAGNOSIS — U071 COVID-19: Secondary | ICD-10-CM | POA: Diagnosis not present

## 2022-03-06 LAB — POCT URINALYSIS DIP (MANUAL ENTRY)
Bilirubin, UA: NEGATIVE
Glucose, UA: NEGATIVE mg/dL
Ketones, POC UA: NEGATIVE mg/dL
Leukocytes, UA: NEGATIVE
Nitrite, UA: NEGATIVE
Protein Ur, POC: NEGATIVE mg/dL
Spec Grav, UA: 1.015 (ref 1.010–1.025)
Urobilinogen, UA: 0.2 E.U./dL
pH, UA: 6 (ref 5.0–8.0)

## 2022-03-06 MED ORDER — BENZONATATE 100 MG PO CAPS: 100.0000 mg | ORAL_CAPSULE | Freq: Three times a day (TID) | ORAL | 0 refills | Status: AC | PRN

## 2022-03-06 NOTE — ED Triage Notes (Addendum)
Pt reports cough and congestion x 2 days. OTC cold and cough meds gives some relief.   Pt reports burning when urinating started today.

## 2022-03-06 NOTE — ED Provider Notes (Signed)
[facility]    CSN: [id number] Arrival date & time: 03/06/22  1338      History   Chief Complaint Chief Complaint  Patient presents with   Cough        Dysuria    HPI [name] is a 72 y.o. female.   The history is provided by the patient.   The patient presents with a 2-day history of cough and congestion.  Patient denies fever, chills, headache, sore throat, runny nose, wheezing, shortness of breath, difficulty breathing, or GI symptoms.  Patient states that she has been vaccinated for COVID, flu, and RSV.  She reports she has been taking Delsym and over-the-counter Tylenol cough and cold medicine which gives her some relief.  She denies any obvious known sick contacts.  She also complains of burning with urination that started today.  She states that it appears symptoms may have have started since she started taking medication for her cold symptoms.  She denies frequency, urgency, hematuria, decreased urine stream, abdominal pain, low back pain, or flank pain.  She denies history of recurrent urinary tract infections.  Past Medical History:  Diagnosis Date   GAD (generalized anxiety disorder)    GAD (generalized anxiety disorder)    GERD (gastroesophageal reflux disease) 02/13/2013   Heart murmur    Hematuria    Lesion of bladder    Renal insufficiency 02/12/2013    Patient Active Problem List   Diagnosis Date Noted   GAD (generalized anxiety disorder) 10/09/2017   Osteopenia 10/12/2014   GERD (gastroesophageal reflux disease) 02/13/2013    Past Surgical History:  Procedure Laterality Date   APPENDECTOMY  03-09-2008   [place] W/ RETROGRADES Bilateral 01/24/2013   Procedure: CYSTOSCOPY WITH RETROGRADE PYELOGRAM;  Surgeon: Molli Hazard, MD;  Location: [facility];  Service: Urology;  Laterality: Bilateral;   CYSTOSCOPY WITH BIOPSY N/A 01/24/2013   Procedure: CYSTOSCOPY WITH BIOPSY;   Surgeon: Molli Hazard, MD;  Location: [facility];  Service: Urology;  Laterality: N/A;   LAPAROSCOPIC APPENDECTOMY N/A 2011   OPEN HIATAL HERNIA REPAIR  1985   TUBAL LIGATION     TUBAL LIGATION Bilateral     OB History   No obstetric history on file.      Home Medications    Prior to Admission medications   Medication Sig Start Date End Date Taking? Authorizing Provider  benzonatate (TESSALON PERLES) 100 MG capsule Take 1 capsule (100 mg total) by mouth 3 (three) times daily as needed for up to 10 days for cough. 03/06/22 03/16/22 Yes Lanya Bucks-Warren, Alda Lea, NP    Family History Family History  Problem Relation Age of Onset   COPD Mother    Cancer Father    Breast cancer Neg Hx     Social History Social History   Tobacco Use   Smoking status: Never   Smokeless tobacco: Never  Substance Use Topics   Alcohol use: No   Drug use: No     Allergies   Asa [aspirin], Ciprofloxacin hcl, Septra ds [sulfamethoxazole-trimethoprim], and Tagamet [cimetidine]   Review of Systems Review of Systems Per HPI  Physical Exam Triage Vital Signs ED Triage Vitals  Enc Vitals Group     BP 03/06/22 1345 136/82     Pulse Rate 03/06/22 1345 78     Resp 03/06/22 1345 18     Temp 03/06/22 1345 98.6 F (37 C)  Temp Source 03/06/22 1345 Oral     SpO2 03/06/22 1345 96 %     Weight --      Height --      Head Circumference --      Peak Flow --      Pain Score 03/06/22 1347 0     Pain Loc --      Pain Edu? --      Excl. in [place]? --    No data found.  Updated Vital Signs BP 136/82 (BP Location: Right Arm)   Pulse 78   Temp 98.6 F (37 C) (Oral)   Resp 18   SpO2 96%   Visual Acuity Right Eye Distance:   Left Eye Distance:   Bilateral Distance:    Right Eye Near:   Left Eye Near:    Bilateral Near:     Physical Exam Vitals and nursing note reviewed.  Constitutional:      General: She is not in acute distress.    Appearance: Normal  appearance. She is well-developed.  HENT:     Head: Normocephalic and atraumatic.     Right Ear: Tympanic membrane, ear canal and external ear normal.     Left Ear: Tympanic membrane, ear canal and external ear normal.     Nose: Nose normal.     Mouth/Throat:     Mouth: Mucous membranes are moist.  Eyes:     Extraocular Movements: Extraocular movements intact.     Conjunctiva/sclera: Conjunctivae normal.     Pupils: Pupils are equal, round, and reactive to light.  Cardiovascular:     Rate and Rhythm: Regular rhythm.     Pulses: Normal pulses.     Heart sounds: Normal heart sounds. No murmur heard. Pulmonary:     Effort: Pulmonary effort is normal. No respiratory distress.     Breath sounds: Normal breath sounds.  Abdominal:     General: Bowel sounds are normal.     Palpations: Abdomen is soft.     Tenderness: There is no abdominal tenderness.  Musculoskeletal:        General: No swelling.     Cervical back: Normal range of motion.  Skin:    General: Skin is warm and dry.     Capillary Refill: Capillary refill takes less than 2 seconds.  Neurological:     General: No focal deficit present.     Mental Status: She is alert and oriented to person, place, and time.  Psychiatric:        Mood and Affect: Mood normal.        Behavior: Behavior normal.      UC Treatments / Results  Labs (all labs ordered are listed, but only abnormal results are displayed) Labs Reviewed  POCT URINALYSIS DIP (MANUAL ENTRY) - Abnormal; Notable for the following components:      Result Value   Blood, UA small (*)    All other components within normal limits  URINE CULTURE    EKG   Radiology No results found.  Procedures Procedures (including critical care time)  Medications Ordered in UC Medications - No data to display  Initial Impression / Assessment and Plan / UC Course  I have reviewed the triage vital signs and the nursing notes.  Pertinent labs & imaging results that were  available during my care of the patient were reviewed by me and considered in my medical decision making (see chart for details).  The patient is well-appearing, she is in no  acute distress, vital signs are stable.  With regard to patient's upper respiratory symptoms, COVID test is pending.  Patient is a candidate to receive Paxlovid if her COVID test is positive.  Suspect a viral upper respiratory infection with cough.  Will treat patient's cough symptoms with Tessalon 100 mg 3 times daily as needed.  Supportive care recommendations were provided to the patient to include increasing fluids, allowing for plenty of rest, use of a humidifier in her bedroom at nighttime during sleep.  With regard to her dysuria, urine culture did not show an obvious urinary tract infection.  Urine culture is pending.  Patient was advised to increase her fluid intake, develop a toileting schedule, and over-the-counter analgesics for pain or discomfort.  Patient is in agreement with the plan of care, and verbalizes understanding.  All questions were answered.  Patient stable for discharge.  Final Clinical Impressions(s) / UC Diagnoses   Final diagnoses:  Viral upper respiratory tract infection with cough  Dysuria     Discharge Instructions      A COVID test is pending.  Your urinalysis did not show an obvious urinary tract infection.  As discussed, urine culture has been ordered.  You will be contacted if your pending test results are positive.  As discussed, you are candidate to receive Paxlovid if your COVID test is positive. Take medication as prescribed. Increase fluids and allow for plenty of rest.  Try to drink at least 8-10 8 ounce glasses of water while you are having urinary symptoms. May take over-the-counter Tylenol as needed for pain, fever, or general discomfort. For your cough, recommend sleeping elevated on pillows and use of a humidifier in your bedroom at nighttime during sleep. Recommend use of  throat lozenges or cough drops while cough persist. For your urinary complaints, make sure you are peeing approximately every 2 hours. Avoid caffeine such as tea, soda, and coffee, as this will make your urinary symptoms worse. As discussed, if your symptoms worsen within the next 7 to 10 days or sooner, please follow-up in this clinic or with your primary care physician for further evaluation. Follow-up as needed.     ED Prescriptions     Medication Sig Dispense Auth. Provider   benzonatate (TESSALON PERLES) 100 MG capsule Take 1 capsule (100 mg total) by mouth 3 (three) times daily as needed for up to 10 days for cough. 30 capsule Dierra Riesgo-Warren, Alda Lea, NP      PDMP not reviewed this encounter.   Tish Men, NP 03/06/22 [phone]

## 2022-03-06 NOTE — Discharge Instructions (Addendum)
A COVID test is pending.  Your urinalysis did not show an obvious urinary tract infection.  As discussed, urine culture has been ordered.  You will be contacted if your pending test results are positive.  As discussed, you are candidate to receive Paxlovid if your COVID test is positive. Take medication as prescribed. Increase fluids and allow for plenty of rest.  Try to drink at least 8-10 8 ounce glasses of water while you are having urinary symptoms. May take over-the-counter Tylenol as needed for pain, fever, or general discomfort. For your cough, recommend sleeping elevated on pillows and use of a humidifier in your bedroom at nighttime during sleep. Recommend use of throat lozenges or cough drops while cough persist. For your urinary complaints, make sure you are peeing approximately every 2 hours. Avoid caffeine such as tea, soda, and coffee, as this will make your urinary symptoms worse. As discussed, if your symptoms worsen within the next 7 to 10 days or sooner, please follow-up in this clinic or with your primary care physician for further evaluation. Follow-up as needed.

## 2022-03-07 ENCOUNTER — Telehealth (HOSPITAL_COMMUNITY): Payer: Self-pay | Admitting: Emergency Medicine

## 2022-03-07 ENCOUNTER — Ambulatory Visit: Payer: Medicare HMO | Admitting: Nurse Practitioner

## 2022-03-07 LAB — SARS CORONAVIRUS 2 (TAT 6-24 HRS): SARS Coronavirus 2: POSITIVE — AB

## 2022-03-07 MED ORDER — NIRMATRELVIR/RITONAVIR (PAXLOVID)TABLET: 3.0000 | ORAL_TABLET | Freq: Two times a day (BID) | ORAL | 0 refills | Status: AC

## 2022-03-08 LAB — URINE CULTURE: Culture: NO GROWTH

## 2022-05-26 ENCOUNTER — Ambulatory Visit: Payer: Medicare HMO | Admitting: Nurse Practitioner

## 2022-05-28 ENCOUNTER — Other Ambulatory Visit: Payer: Self-pay

## 2022-05-28 DIAGNOSIS — Z1231 Encounter for screening mammogram for malignant neoplasm of breast: Secondary | ICD-10-CM

## 2022-05-30 ENCOUNTER — Encounter: Payer: Self-pay | Admitting: Nurse Practitioner

## 2022-05-30 ENCOUNTER — Other Ambulatory Visit (HOSPITAL_COMMUNITY)
Admission: RE | Admit: 2022-05-30 | Discharge: 2022-05-30 | Disposition: A | Discharge status: Home / Self Care | Payer: Medicare HMO | Source: Ambulatory Visit | Attending: Nurse Practitioner | Admitting: Nurse Practitioner

## 2022-05-30 ENCOUNTER — Ambulatory Visit (INDEPENDENT_AMBULATORY_CARE_PROVIDER_SITE_OTHER): Payer: Medicare HMO | Admitting: Nurse Practitioner

## 2022-05-30 VITALS — BP 120/61 | HR 68 | Temp 98.0°F | Resp 20 | Ht 64.0 in | Wt 129.0 lb

## 2022-05-30 DIAGNOSIS — Z1151 Encounter for screening for human papillomavirus (HPV): Secondary | ICD-10-CM | POA: Insufficient documentation

## 2022-05-30 DIAGNOSIS — Z0000 Encounter for general adult medical examination without abnormal findings: Secondary | ICD-10-CM | POA: Diagnosis not present

## 2022-05-30 DIAGNOSIS — Z01419 Encounter for gynecological examination (general) (routine) without abnormal findings: Secondary | ICD-10-CM | POA: Insufficient documentation

## 2022-05-30 DIAGNOSIS — M8588 Other specified disorders of bone density and structure, other site: Secondary | ICD-10-CM

## 2022-05-30 DIAGNOSIS — R6889 Other general symptoms and signs: Secondary | ICD-10-CM | POA: Diagnosis not present

## 2022-05-30 DIAGNOSIS — K219 Gastro-esophageal reflux disease without esophagitis: Secondary | ICD-10-CM

## 2022-05-30 DIAGNOSIS — Z136 Encounter for screening for cardiovascular disorders: Secondary | ICD-10-CM | POA: Diagnosis not present

## 2022-05-30 DIAGNOSIS — F411 Generalized anxiety disorder: Secondary | ICD-10-CM | POA: Diagnosis not present

## 2022-05-30 DIAGNOSIS — Z0001 Encounter for general adult medical examination with abnormal findings: Secondary | ICD-10-CM | POA: Diagnosis not present

## 2022-05-30 DIAGNOSIS — M8580 Other specified disorders of bone density and structure, unspecified site: Secondary | ICD-10-CM | POA: Diagnosis not present

## 2022-05-30 LAB — LIPID PANEL

## 2022-05-30 NOTE — Patient Instructions (Signed)
Exercising to Stay Healthy To become healthy and stay healthy, it is recommended that you do moderate-intensity and vigorous-intensity exercise. You can tell that you are exercising at a moderate intensity if your heart starts beating faster and you start breathing faster but can still hold a conversation. You can tell that you are exercising at a vigorous intensity if you are breathing much harder and faster and cannot hold a conversation while exercising. How can exercise benefit me? Exercising regularly is important. It has many health benefits, such as: Improving overall fitness, flexibility, and endurance. Increasing bone density. Helping with weight control. Decreasing body fat. Increasing muscle strength and endurance. Reducing stress and tension, anxiety, depression, or anger. Improving overall health. What guidelines should I follow while exercising? Before you start a new exercise program, talk with your health care provider. Do not exercise so much that you hurt yourself, feel dizzy, or get very short of breath. Wear comfortable clothes and wear shoes with good support. Drink plenty of water while you exercise to prevent dehydration or heat stroke. Work out until your breathing and your heartbeat get faster (moderate intensity). How often should I exercise? Choose an activity that you enjoy, and set realistic goals. Your health care provider can help you make an activity plan that is individually designed and works best for you. Exercise regularly as told by your health care provider. This may include: Doing strength training two times a week, such as: Lifting weights. Using resistance bands. Push-ups. Sit-ups. Yoga. Doing a certain intensity of exercise for a given amount of time. Choose from these options: A total of 150 minutes of moderate-intensity exercise every week. A total of 75 minutes of vigorous-intensity exercise every week. A mix of moderate-intensity and  vigorous-intensity exercise every week. Children, pregnant women, people who have not exercised regularly, people who are overweight, and older adults may need to talk with a health care provider about what activities are safe to perform. If you have a medical condition, be sure to talk with your health care provider before you start a new exercise program. What are some exercise ideas? Moderate-intensity exercise ideas include: Walking 1 mile (1.6 km) in about 15 minutes. Biking. Hiking. Golfing. Dancing. Water aerobics. Vigorous-intensity exercise ideas include: Walking 4.5 miles (7.2 km) or more in about 1 hour. Jogging or running 5 miles (8 km) in about 1 hour. Biking 10 miles (16.1 km) or more in about 1 hour. Lap swimming. Roller-skating or in-line skating. Cross-country skiing. Vigorous competitive sports, such as football, basketball, and soccer. Jumping rope. Aerobic dancing. What are some everyday activities that can help me get exercise? Yard work, such as: Pushing a lawn mower. Raking and bagging leaves. Washing your car. Pushing a stroller. Shoveling snow. Gardening. Washing windows or floors. How can I be more active in my day-to-day activities? Use stairs instead of an elevator. Take a walk during your lunch break. If you drive, park your car farther away from your work or school. If you take public transportation, get off one stop early and walk the rest of the way. Stand up or walk around during all of your indoor phone calls. Get up, stretch, and walk around every 30 minutes throughout the day. Enjoy exercise with a friend. Support to continue exercising will help you keep a regular routine of activity. Where to find more information You can find more information about exercising to stay healthy from: U.[place] Department of Health and Human Services: www.hhs.gov Centers for Disease Control and Prevention (  CDC): www.cdc.gov Summary Exercising regularly is  important. It will improve your overall fitness, flexibility, and endurance. Regular exercise will also improve your overall health. It can help you control your weight, reduce stress, and improve your bone density. Do not exercise so much that you hurt yourself, feel dizzy, or get very short of breath. Before you start a new exercise program, talk with your health care provider. This information is not intended to replace advice given to you by your health care provider. Make sure you discuss any questions you have with your health care provider. Document Revised: 04/27/2020 Document Reviewed: 04/27/2020 Elsevier Patient Education  2023 Elsevier Inc.  

## 2022-05-30 NOTE — Progress Notes (Signed)
Subjective:    Patient ID: [name], female    DOB: 24-Jul-1950, 72 y.o.   MRN: [id number]  Chief Complaint: Annual Exam    HPI:  [name] is a 72 y.o. who identifies as a female who was assigned female at birth.   Social history: Lives with: husband Work history: retired   Water engineer in today for follow up of the following chronic medical issues:  1. Annual physical exam With PAP- menopausal for year. Last pap was normal  2. Gastroesophageal reflux disease without esophagitis Only uses OTC meds as needed  3. GAD (generalized anxiety disorder) Is doing ok without meds    05/30/2022   11:46 AM 03/29/2021   11:56 AM 08/30/2020    9:35 AM 06/15/2020    1:53 PM  GAD 7 : Generalized Anxiety Score  Nervous, Anxious, on Edge 0 0 0 0  Control/stop worrying 0 0 0 0  Worry too much - different things 0 0 0 0  Trouble relaxing 0 0 0 0  Restless 0 0 0 0  Easily annoyed or irritable 0 0 0 0  Afraid - awful might happen 0 0 0 0  Total GAD 7 Score 0 0 0 0  Anxiety Difficulty Not difficult at all Not difficult at all Not difficult at all Not difficult at all      4. Osteopenia of lumbar spine Last dexascan as done was done on 08/31/20. Her t score was -1.4.   New complaints: None today  Allergies  Allergen Reactions   Asa [Aspirin] Other (See Comments)    Contraindicated for hernia   Ciprofloxacin Hcl Other (See Comments)    Burning  both legs below knees as if on fire.   Septra Ds [Sulfamethoxazole-Trimethoprim]     Rapid HR, sweats , and low blood sugar   Tagamet [Cimetidine] Rash   No outpatient encounter medications on file as of 05/30/2022.   No facility-administered encounter medications on file as of 05/30/2022.    Past Surgical History:  Procedure Laterality Date   APPENDECTOMY  03-09-2008   CHOLECYSTECTOMY  1990   CHOLECYSTECTOMY     CYSTOSCOPY W/ RETROGRADES Bilateral 01/24/2013   Procedure: CYSTOSCOPY WITH RETROGRADE PYELOGRAM;  Surgeon: Milford Cage, MD;  Location: [facility];  Service: Urology;  Laterality: Bilateral;   CYSTOSCOPY WITH BIOPSY N/A 01/24/2013   Procedure: CYSTOSCOPY WITH BIOPSY;  Surgeon: Milford Cage, MD;  Location: [facility];  Service: Urology;  Laterality: N/A;   LAPAROSCOPIC APPENDECTOMY N/A 2011   OPEN HIATAL HERNIA REPAIR  1985   TUBAL LIGATION     TUBAL LIGATION Bilateral     Family History  Problem Relation Age of Onset   COPD Mother    Cancer Father    Breast cancer Neg Hx       Controlled substance contract: n/a     Review of Systems  Constitutional:  Negative for diaphoresis.  Eyes:  Negative for pain.  Respiratory:  Negative for shortness of breath.   Cardiovascular:  Negative for chest pain, palpitations and leg swelling.  Gastrointestinal:  Negative for abdominal pain.  Endocrine: Negative for polydipsia.  Skin:  Negative for rash.  Neurological:  Negative for dizziness, weakness and headaches.  Hematological:  Does not bruise/bleed easily.  All other systems reviewed and are negative.      Objective:   Physical Exam Vitals and nursing note reviewed.  Constitutional:      General: She is  not in acute distress.    Appearance: Normal appearance. She is well-developed.  HENT:     Head: Normocephalic.     Right Ear: Tympanic membrane normal.     Left Ear: Tympanic membrane normal.     Nose: Nose normal.     Mouth/Throat:     Mouth: Mucous membranes are moist.  Eyes:     Pupils: Pupils are equal, round, and reactive to light.  Neck:     Vascular: No carotid bruit or JVD.  Cardiovascular:     Rate and Rhythm: Normal rate and regular rhythm.     Heart sounds: Normal heart sounds.  Pulmonary:     Effort: Pulmonary effort is normal. No respiratory distress.     Breath sounds: Normal breath sounds. No wheezing or rales.  Chest:     Chest wall: No tenderness.  Abdominal:     General: Bowel sounds are normal. There is no distension  or abdominal bruit.     Palpations: Abdomen is soft. There is no hepatomegaly, splenomegaly, mass or pulsatile mass.     Tenderness: There is no abdominal tenderness.  Genitourinary:    General: Normal vulva.     Vagina: No vaginal discharge.     Rectum: Normal.     Comments: Cervix non parous and pink No adnexal masses or tenderness Musculoskeletal:        General: Normal range of motion.     Cervical back: Normal range of motion and neck supple.  Lymphadenopathy:     Cervical: No cervical adenopathy.  Skin:    General: Skin is warm and dry.  Neurological:     Mental Status: She is alert and oriented to person, place, and time.     Deep Tendon Reflexes: Reflexes are normal and symmetric.  Psychiatric:        Behavior: Behavior normal.        Thought Content: Thought content normal.        Judgment: Judgment normal.    BP 120/61   Pulse 68   Temp 98 F (36.7 C) (Temporal)   Resp 20   Ht 5\' 4"  (1.626 m)   Wt 129 lb (58.5 kg)   SpO2 99%   BMI 22.14 kg/m         Assessment & Plan:  TRUC SEETON comes in today with chief complaint of Annual Exam   Diagnosis and orders addressed:  1. Annual physical exam - CBC with Differential/Platelet - CMP14+EGFR - Lipid panel - Thyroid Panel With TSH - VITAMIN D 25 Hydroxy (Vit-D Deficiency, Fractures) - Cytology - PAP  2. Gastroesophageal reflux disease without esophagitis Avoid spicy foods Do not eat 2 hours prior to bedtime   3. GAD (generalized anxiety disorder) Stress management  4. Osteopenia of lumbar spine Weight bearing exercises   Labs pending Health Maintenance reviewed Diet and exercise encouraged  Follow up plan: 1 year   Mary-Margaret Daphine Deutscher, FNP

## 2022-05-31 LAB — VITAMIN D 25 HYDROXY (VIT D DEFICIENCY, FRACTURES): Vit D, 25-Hydroxy: 36.1 ng/mL (ref 30.0–100.0)

## 2022-05-31 LAB — CMP14+EGFR
ALT: 14 IU/L (ref 0–32)
AST: 18 IU/L (ref 0–40)
Albumin/Globulin Ratio: 1.8 (ref 1.2–2.2)
Albumin: 4.5 g/dL (ref 3.9–4.9)
Alkaline Phosphatase: 65 IU/L (ref 44–121)
BUN/Creatinine Ratio: 15 (ref 12–28)
BUN: 15 mg/dL (ref 8–27)
Bilirubin Total: 0.7 mg/dL (ref 0.0–1.2)
CO2: 21 mmol/L (ref 20–29)
Calcium: 9.5 mg/dL (ref 8.7–10.3)
Chloride: 104 mmol/L (ref 96–106)
Creatinine, Ser: 1 mg/dL (ref 0.57–1.00)
Globulin, Total: 2.5 g/dL (ref 1.5–4.5)
Glucose: 99 mg/dL (ref 70–99)
Potassium: 4.4 mmol/L (ref 3.5–5.2)
Sodium: 142 mmol/L (ref 134–144)
Total Protein: 7 g/dL (ref 6.0–8.5)
eGFR: 61 mL/min/{1.73_m2} (ref >59)

## 2022-05-31 LAB — CBC WITH DIFFERENTIAL/PLATELET
Basophils Absolute: 0 10*3/uL (ref 0.0–0.2)
Basos: 1 %
EOS (ABSOLUTE): 0.1 10*3/uL (ref 0.0–0.4)
Eos: 1 %
Hematocrit: 35 % (ref 34.0–46.6)
Hemoglobin: 11.6 g/dL (ref 11.1–15.9)
Immature Grans (Abs): 0 10*3/uL (ref 0.0–0.1)
Immature Granulocytes: 0 %
Lymphocytes Absolute: 1.2 10*3/uL (ref 0.7–3.1)
Lymphs: 22 %
MCH: 32 pg (ref 26.6–33.0)
MCHC: 33.1 g/dL (ref 31.5–35.7)
MCV: 97 fL (ref 79–97)
Monocytes Absolute: 0.6 10*3/uL (ref 0.1–0.9)
Monocytes: 10 %
Neutrophils Absolute: 3.7 10*3/uL (ref 1.4–7.0)
Neutrophils: 66 %
Platelets: 233 10*3/uL (ref 150–450)
RBC: 3.62 x10E6/uL — ABNORMAL LOW (ref 3.77–5.28)
RDW: 11.3 % — ABNORMAL LOW (ref 11.7–15.4)
WBC: 5.6 10*3/uL (ref 3.4–10.8)

## 2022-05-31 LAB — THYROID PANEL WITH TSH
Free Thyroxine Index: 3.3 (ref 1.2–4.9)
T3 Uptake Ratio: 38 % (ref 24–39)
T4, Total: 8.6 ug/dL (ref 4.5–12.0)
TSH: 0.83 u[IU]/mL (ref 0.450–4.500)

## 2022-05-31 LAB — LIPID PANEL
Chol/HDL Ratio: 3.1 ratio (ref 0.0–4.4)
Cholesterol, Total: 190 mg/dL (ref 100–199)
HDL: 62 mg/dL (ref >39)
LDL Chol Calc (NIH): 108 mg/dL — ABNORMAL HIGH (ref 0–99)
Triglycerides: 112 mg/dL (ref 0–149)
VLDL Cholesterol Cal: 20 mg/dL (ref 5–40)

## 2022-06-05 LAB — CYTOLOGY - PAP: Diagnosis: NEGATIVE

## 2022-06-11 ENCOUNTER — Ambulatory Visit
Admission: RE | Admit: 2022-06-11 | Discharge: 2022-06-11 | Disposition: A | Discharge status: Home / Self Care | Payer: Medicare HMO | Source: Ambulatory Visit | Attending: Nurse Practitioner | Admitting: Nurse Practitioner

## 2022-06-11 DIAGNOSIS — Z1231 Encounter for screening mammogram for malignant neoplasm of breast: Secondary | ICD-10-CM

## 2022-06-12 DIAGNOSIS — R051 Acute cough: Secondary | ICD-10-CM | POA: Diagnosis not present

## 2022-06-12 DIAGNOSIS — R059 Cough, unspecified: Secondary | ICD-10-CM | POA: Diagnosis not present

## 2022-06-12 DIAGNOSIS — R070 Pain in throat: Secondary | ICD-10-CM | POA: Diagnosis not present

## 2022-06-12 DIAGNOSIS — J0100 Acute maxillary sinusitis, unspecified: Secondary | ICD-10-CM | POA: Diagnosis not present

## 2022-06-18 IMAGING — MG MM DIGITAL SCREENING BILAT W/ TOMO AND CAD
6 of 10 series · 6 of 30 positions shown · non-contrast
Comparison: Previous exam(s).

CLINICAL DATA: Screening.

EXAM:
DIGITAL SCREENING BILATERAL MAMMOGRAM WITH TOMOSYNTHESIS AND CAD
TECHNIQUE: Bilateral screening digital craniocaudal and mediolateral oblique
mammograms were obtained. Bilateral screening digital breast
tomosynthesis was performed. The images were evaluated with
computer-aided detection.

[R MLO synth-2D (1 of 2)]
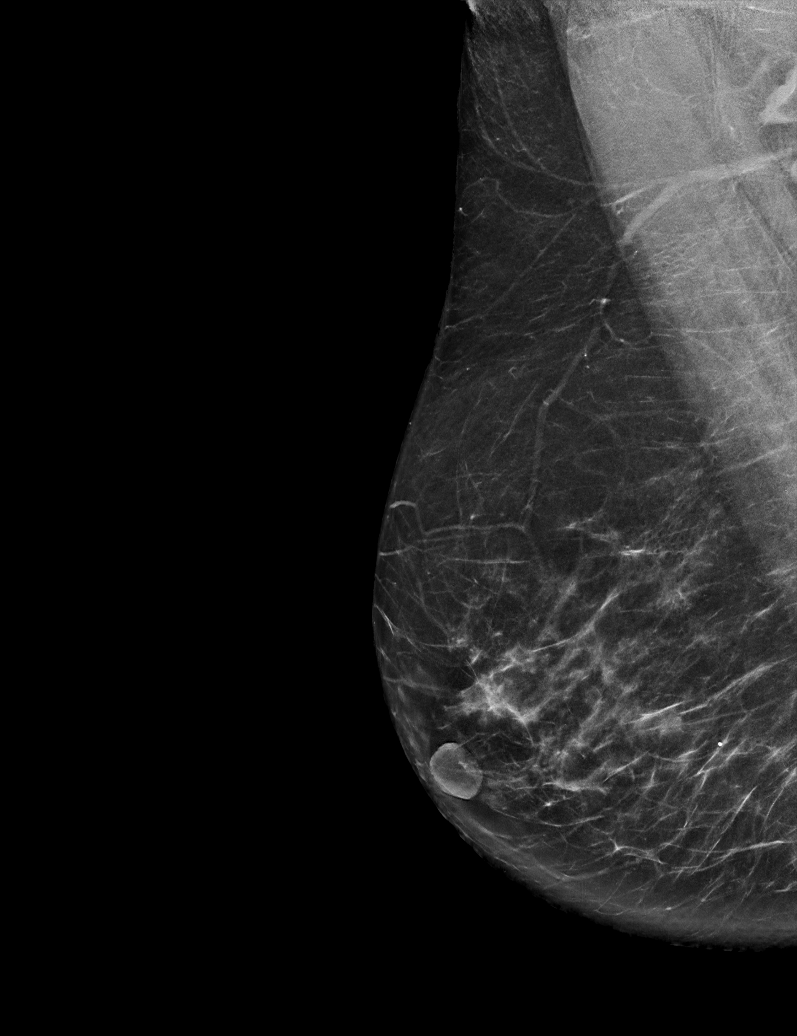

[R CC synth-2D]
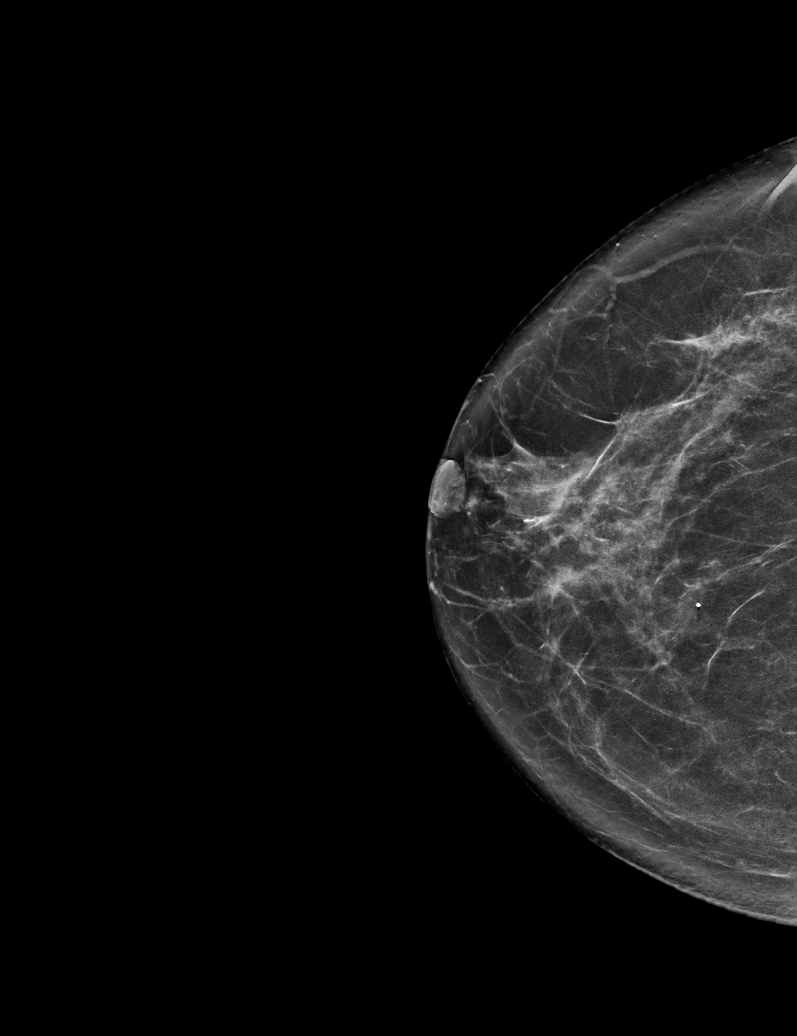

[L CC synth-2D]
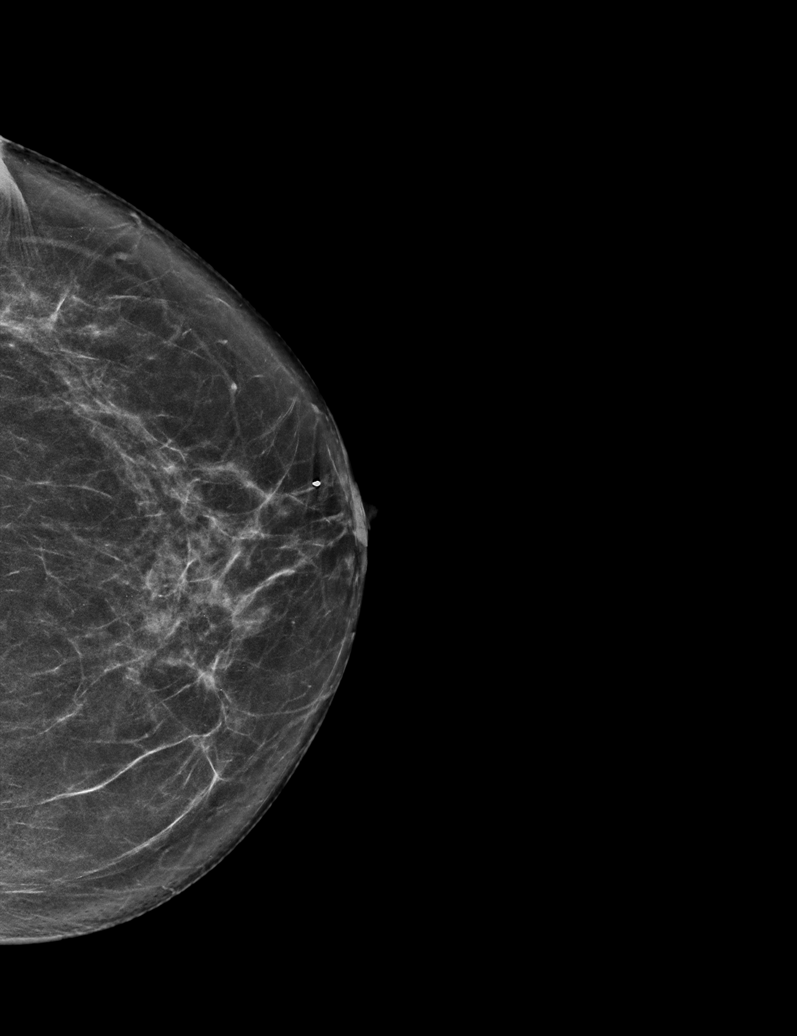

[R MLO synth-2D (2 of 2)]
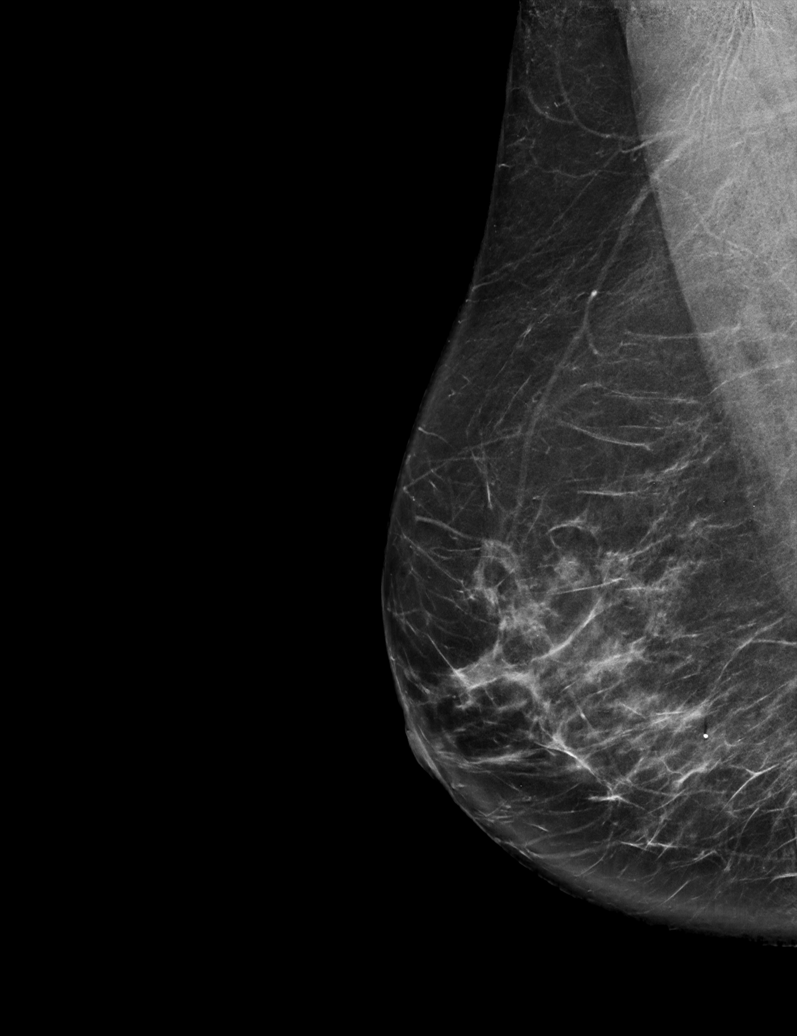

[L MLO synth-2D]
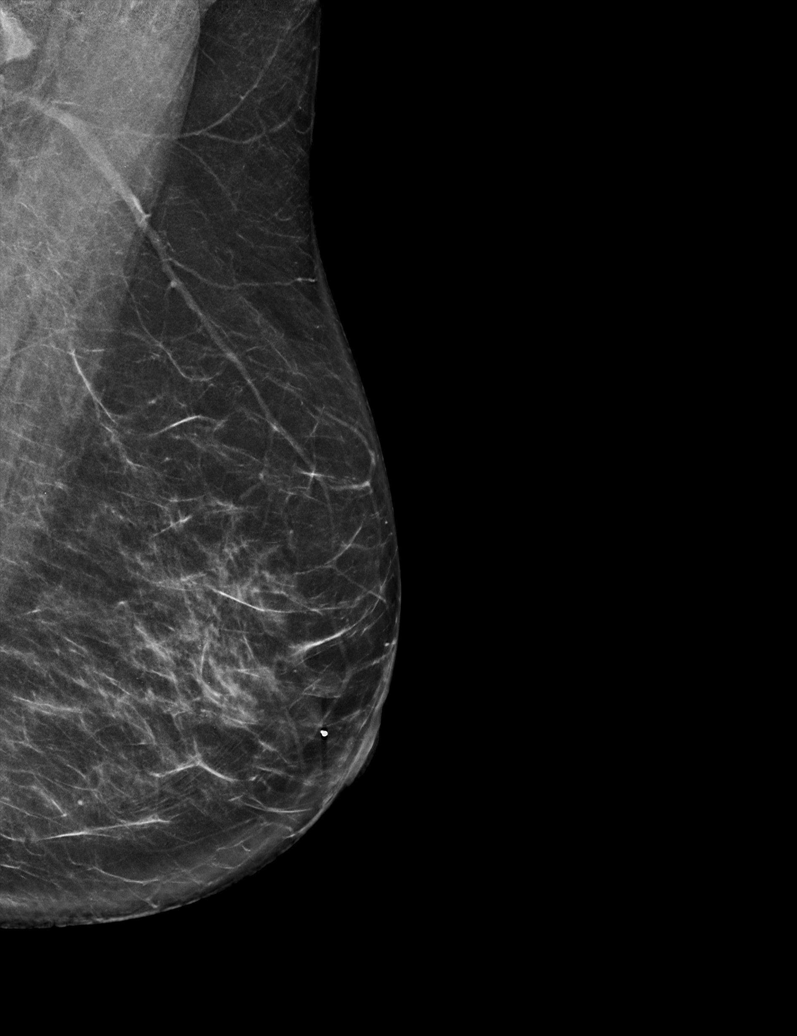

[L CC tomo · tomo slice 33/65.0]
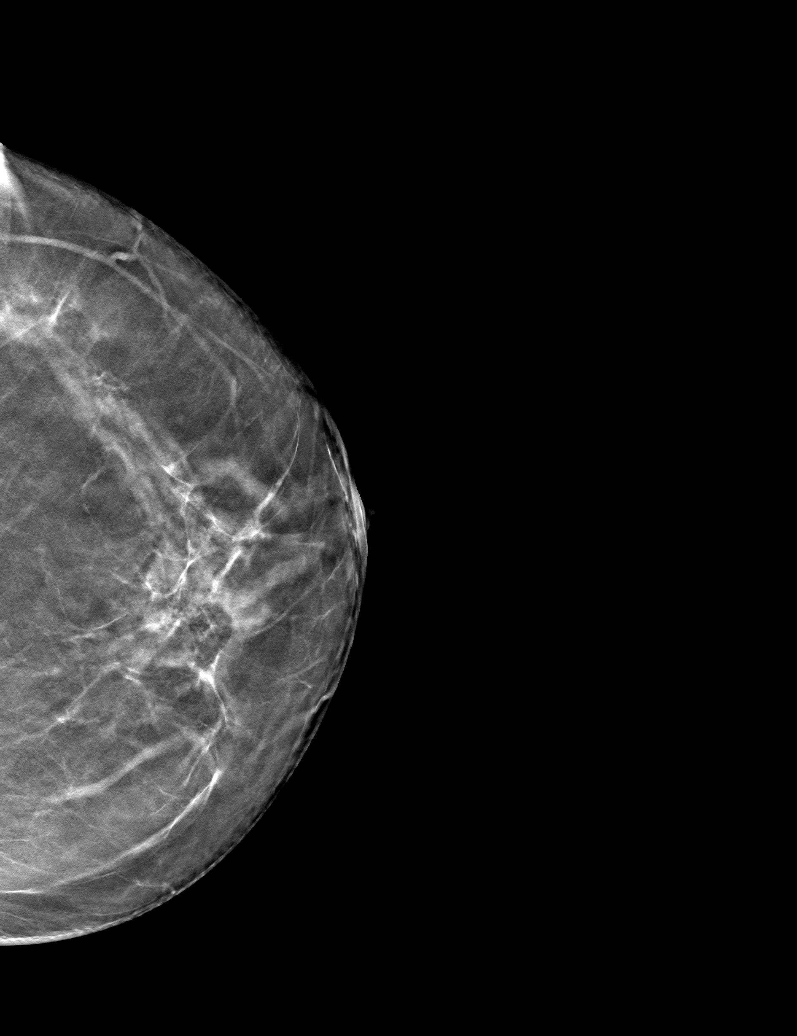

[6 of 30 positions shown; findings below may reference images not displayed]

ACR Breast Density Category b: There are scattered areas of
fibroglandular density.
FINDINGS: There are no findings suspicious for malignancy.
IMPRESSION: No mammographic evidence of malignancy. A result letter of this
screening mammogram will be mailed directly to the patient.

RECOMMENDATION:
Screening mammogram in one year. (Code:51-O-LD2)

BI-RADS CATEGORY  1: Negative.

## 2022-10-17 ENCOUNTER — Other Ambulatory Visit: Payer: Self-pay | Admitting: Nurse Practitioner

## 2022-10-17 DIAGNOSIS — Z1211 Encounter for screening for malignant neoplasm of colon: Secondary | ICD-10-CM

## 2022-10-17 DIAGNOSIS — Z1212 Encounter for screening for malignant neoplasm of rectum: Secondary | ICD-10-CM

## 2022-10-29 DIAGNOSIS — Z1212 Encounter for screening for malignant neoplasm of rectum: Secondary | ICD-10-CM | POA: Diagnosis not present

## 2022-10-29 DIAGNOSIS — Z1211 Encounter for screening for malignant neoplasm of colon: Secondary | ICD-10-CM | POA: Diagnosis not present

## 2022-11-06 LAB — COLOGUARD: COLOGUARD: NEGATIVE

## 2022-12-26 DIAGNOSIS — L821 Other seborrheic keratosis: Secondary | ICD-10-CM | POA: Diagnosis not present

## 2022-12-26 DIAGNOSIS — L853 Xerosis cutis: Secondary | ICD-10-CM | POA: Diagnosis not present

## 2022-12-26 DIAGNOSIS — L570 Actinic keratosis: Secondary | ICD-10-CM | POA: Diagnosis not present

## 2023-04-23 ENCOUNTER — Ambulatory Visit: Payer: Medicare HMO

## 2023-04-23 VITALS — BP 120/61 | HR 68 | Ht 64.0 in | Wt 129.0 lb

## 2023-04-23 DIAGNOSIS — Z0000 Encounter for general adult medical examination without abnormal findings: Secondary | ICD-10-CM | POA: Diagnosis not present

## 2023-04-23 NOTE — Patient Instructions (Signed)
 [name] , Thank you for taking time to come for your Medicare Wellness Visit. I appreciate your ongoing commitment to your health goals. Please review the following plan we discussed and let me know if I can assist you in the future.   Referrals/Orders/Follow-Ups/Clinician Recommendations:Please remember to make your Bone Density Exam appointment for after  8/25.   This is a list of the screening recommended for you and due dates:  Health Maintenance  Topic Date Due   DEXA scan (bone density measurement)  08/31/2022   COVID-19 Vaccine (1 - 2024-25 season) 05/08/2024*   Mammogram  06/11/2023   Flu Shot  08/14/2023   Medicare Annual Wellness Visit  04/22/2024   Cologuard (Stool DNA test)  10/28/2025   DTaP/Tdap/Td vaccine (2 - Td or Tdap) 10/08/2027   Pneumonia Vaccine  Completed   Hepatitis C Screening  Completed   Zoster (Shingles) Vaccine  Completed   HPV Vaccine  Aged Out   Meningitis B Vaccine  Aged Out  *Topic was postponed. The date shown is not the original due date.    Advanced directives: (Declined) Advance directive discussed with you today. Even though you declined this today, please call our office should you change your mind, and we can give you the proper paperwork for you to fill out.  Next Medicare Annual Wellness Visit scheduled for next year: Yes

## 2023-04-23 NOTE — Progress Notes (Signed)
 Subjective:   [name] is a 73 y.o. who presents for a Medicare Wellness preventive visit.  Visit Complete: Virtual I connected with  Shearon Balo on 04/23/23 by a audio enabled telemedicine application and verified that I am speaking with the correct person using two identifiers.  Patient Location: Home  Provider Location: Home Office  I discussed the limitations of evaluation and management by telemedicine. The patient expressed understanding and agreed to proceed.  Vital Signs: Because this visit was a virtual/telehealth visit, some criteria may be missing or patient reported. Any vitals not documented were not able to be obtained and vitals that have been documented are patient reported.  VideoDeclined- This patient declined Librarian, academic. Therefore the visit was completed with audio only.  Persons Participating in Visit: Patient.  AWV Questionnaire: No: Patient Medicare AWV questionnaire was not completed prior to this visit.  Cardiac Risk Factors include: advanced age (>66men, >32 women)     Objective:    Today's Vitals   04/23/23 1349  BP: 120/61  Pulse: 68  Weight: 129 lb (58.5 kg)  Height: 5\' 4"  (1.626 m)   Body mass index is 22.14 kg/m.     04/23/2023    1:56 PM 01/21/2022    3:30 PM 01/11/2021    3:14 PM 02/12/2013    5:00 AM 01/24/2013   11:10 AM  Advanced Directives  Does Patient Have a Medical Advance Directive? No No No Patient does not have advance directive;Patient would not like information Patient does not have advance directive  Would patient like information on creating a medical advance directive?  No - Patient declined No - Patient declined    Pre-existing out of facility DNR order (yellow form or pink MOST form)    No     Current Medications (verified) No outpatient encounter medications on file as of 04/23/2023.   No facility-administered encounter medications on file as of 04/23/2023.    Allergies  (verified) Asa [aspirin], Ciprofloxacin hcl, Septra ds [sulfamethoxazole-trimethoprim], and Tagamet [cimetidine]   History: Past Medical History:  Diagnosis Date   GAD (generalized anxiety disorder)    GAD (generalized anxiety disorder)    GERD (gastroesophageal reflux disease) 02/13/2013   Heart murmur    Hematuria    Lesion of bladder    Renal insufficiency 02/12/2013   Past Surgical History:  Procedure Laterality Date   APPENDECTOMY  03-09-2008   CHOLECYSTECTOMY  1990   CHOLECYSTECTOMY     CYSTOSCOPY W/ RETROGRADES Bilateral 01/24/2013   Procedure: CYSTOSCOPY WITH RETROGRADE PYELOGRAM;  Surgeon: Milford Cage, MD;  Location: [facility];  Service: Urology;  Laterality: Bilateral;   CYSTOSCOPY WITH BIOPSY N/A 01/24/2013   Procedure: CYSTOSCOPY WITH BIOPSY;  Surgeon: Milford Cage, MD;  Location: [facility];  Service: Urology;  Laterality: N/A;   LAPAROSCOPIC APPENDECTOMY N/A 2011   OPEN HIATAL HERNIA REPAIR  1985   TUBAL LIGATION     TUBAL LIGATION Bilateral    Family History  Problem Relation Age of Onset   COPD Mother    Cancer Father    Breast cancer Neg Hx    Social History   Socioeconomic History   Marital status: Married    Spouse name: Not on file   Number of children: Not on file   Years of education: Not on file   Highest education level: Not on file  Occupational History   Not on file  Tobacco Use   Smoking status:  Never   Smokeless tobacco: Never  Substance and Sexual Activity   Alcohol use: No   Drug use: No   Sexual activity: Yes    Birth control/protection: Post-menopausal  Other Topics Concern   Not on file  Social History Narrative   Not on file   Social Drivers of Health   Financial Resource Strain: Low Risk  (04/23/2023)   Overall Financial Resource Strain (CARDIA)    Difficulty of Paying Living Expenses: Not hard at all  Food Insecurity: No Food Insecurity (04/23/2023)   Hunger Vital Sign     Worried About Running Out of Food in the Last Year: Never true    Ran Out of Food in the Last Year: Never true  Transportation Needs: No Transportation Needs (01/21/2022)   PRAPARE - Administrator, Civil Service (Medical): No    Lack of Transportation (Non-Medical): No  Physical Activity: Insufficiently Active (04/23/2023)   Exercise Vital Sign    Days of Exercise per Week: 3 days    Minutes of Exercise per Session: 30 min  Stress: No Stress Concern Present (04/23/2023)   [employer] of Occupational Health - Occupational Stress Questionnaire    Feeling of Stress : Not at all  Social Connections: Moderately Integrated (04/23/2023)   Social Connection and Isolation Panel [NHANES]    Frequency of Communication with Friends and Family: More than three times a week    Frequency of Social Gatherings with Friends and Family: More than three times a week    Attends Religious Services: More than 4 times per year    Active Member of [employer] or Organizations: No    Attends Engineer, structural: Never    Marital Status: Married    Tobacco Counseling Counseling given: Yes    Clinical Intake:  Pre-visit preparation completed: Yes  Pain : No/denies pain     BMI - recorded: 22.14 Nutritional Status: BMI of 19-24  Normal Nutritional Risks: None Diabetes: No  No results found for: "HGBA1C"   How often do you need to have someone help you when you read instructions, pamphlets, or other written materials from your doctor or pharmacy?: 1 - Never  Interpreter Needed?: No  Information entered by :: Zane Herald T/CMA   Activities of Daily Living     04/23/2023    1:54 PM  In your present state of health, do you have any difficulty performing the following activities:  Hearing? 0  Vision? 0  Difficulty concentrating or making decisions? 0  Walking or climbing stairs? 0  Dressing or bathing? 0  Doing errands, shopping? 0  Preparing Food and eating ? N  Using the  Toilet? N  In the past six months, have you accidently leaked urine? N  Do you have problems with loss of bowel control? N  Managing your Medications? N  Managing your Finances? N  Housekeeping or managing your Housekeeping? N    Patient Care Team: Bennie Pierini, FNP as PCP - General (Nurse Practitioner)  Indicate any recent Medical Services you may have received from other than [facility] providers in the past year (date may be approximate).     Assessment:   This is a routine wellness examination for [place].  Hearing/Vision screen Hearing Screening - Comments:: Pt denies hearing def  Vision Screening - Comments:: Pt denies vision def Pt goes [facility] in [place], [place]   Goals Addressed             This Visit's Progress  Patient Stated       Staying active/mobile       Depression Screen     04/23/2023    1:52 PM 05/30/2022   11:46 AM 01/21/2022    3:29 PM 03/29/2021   11:56 AM 08/30/2020    9:35 AM 07/23/2020    2:54 PM 06/15/2020    1:53 PM  PHQ 2/9 Scores  PHQ - 2 Score 0 0 0 0 0 0 0  PHQ- 9 Score 0 0  0 0      Fall Risk     04/23/2023    1:52 PM 05/30/2022   11:46 AM 01/21/2022    3:28 PM 03/29/2021   11:56 AM 08/30/2020    9:35 AM  Fall Risk   Falls in the past year? 0 0 0 0 0  Number falls in past yr: 0  0    Injury with Fall? 0  0    Risk for fall due to : No Fall Risks  No Fall Risks    Follow up Falls prevention discussed;Falls evaluation completed  Falls prevention discussed      MEDICARE RISK AT HOME:  Medicare Risk at Home Any stairs in or around the home?: Yes If so, are there any without handrails?: Yes Home free of loose throw rugs in walkways, pet beds, electrical cords, etc?: Yes Adequate lighting in your home to reduce risk of falls?: Yes Life alert?: No Use of a cane, walker or w/c?: No Grab bars in the bathroom?: No Shower chair or bench in shower?: Yes Elevated toilet seat or a handicapped toilet?: No  TIMED UP AND GO:  Was the  test performed?  No  Cognitive Function: 6CIT completed        04/23/2023    2:00 PM 01/21/2022    3:30 PM 01/11/2021    3:16 PM  6CIT Screen  What Year? 0 points 0 points 0 points  What month? 0 points 0 points 0 points  What time? 0 points 0 points 0 points  Count back from 20 0 points 0 points 0 points  Months in reverse 0 points 0 points 0 points  Repeat phrase 0 points 0 points 0 points  Total Score 0 points 0 points 0 points    Immunizations Immunization History  Administered Date(s) Administered   Influenza Split 10/27/2018   Influenza, Quadrivalent, Recombinant, Inj, Pf 10/07/2017   Influenza,inj,Quad PF,6+ Mos 11/30/2012, 10/17/2013, 10/12/2014   Influenza-Unspecified 10/25/2015, 10/16/2016, 10/07/2017, 10/14/2019   Pneumococcal Conjugate-13 02/19/2018   Pneumococcal Polysaccharide-23 08/30/2019   Tdap 10/07/2017   Zoster Recombinant(Shingrix) 08/03/2020, 10/12/2020   Zoster, Live 09/09/2012    Screening Tests Health Maintenance  Topic Date Due   DEXA SCAN  08/31/2022   COVID-19 Vaccine (1 - 2024-25 season) 05/08/2024 (Originally 09/14/2022)   MAMMOGRAM  06/11/2023   INFLUENZA VACCINE  08/14/2023   Medicare Annual Wellness (AWV)  04/22/2024   Fecal DNA (Cologuard)  10/28/2025   DTaP/Tdap/Td (2 - Td or Tdap) 10/08/2027   Pneumonia Vaccine 69+ Years old  Completed   Hepatitis C Screening  Completed   Zoster Vaccines- Shingrix  Completed   HPV VACCINES  Aged Out   Meningococcal B Vaccine  Aged Out    Health Maintenance  Health Maintenance Due  Topic Date Due   DEXA SCAN  08/31/2022   Health Maintenance Items Addressed: See Nurse Notes  Additional Screening:  Vision Screening: Recommended annual ophthalmology exams for early detection of glaucoma and other disorders of the  eye.  Dental Screening: Recommended annual dental exams for proper oral hygiene  Community Resource Referral / Chronic Care Management: CRR required this visit?  No   CCM  required this visit?  No     Plan:     I have personally reviewed and noted the following in the patient's chart:   Medical and social history Use of alcohol, tobacco or illicit drugs  Current medications and supplements including opioid prescriptions. Patient is not currently taking opioid prescriptions. Functional ability and status Nutritional status Physical activity Advanced directives List of other physicians Hospitalizations, surgeries, and ER visits in previous 12 months Vitals Screenings to include cognitive, depression, and falls Referrals and appointments  In addition, I have reviewed and discussed with patient certain preventive protocols, quality metrics, and best practice recommendations. A written personalized care plan for preventive services as well as general preventive health recommendations were provided to patient.     Arta Silence, CMA   04/23/2023   After Visit Summary: (Declined) Due to this being a telephonic visit, with patients personalized plan was offered to patient but patient Declined AVS at this time   Notes: Referrals/Orders/Follow-Ups/Clinician Recommendations:Please remember to make your Bone Density Exam appointment for after  8/25. [place] for Dexa has been placed. Alia t/cma

## 2023-06-09 ENCOUNTER — Other Ambulatory Visit: Payer: Self-pay | Admitting: Nurse Practitioner

## 2023-06-09 DIAGNOSIS — Z1231 Encounter for screening mammogram for malignant neoplasm of breast: Secondary | ICD-10-CM

## 2023-06-22 ENCOUNTER — Encounter: Payer: Self-pay | Admitting: Nurse Practitioner

## 2023-06-22 ENCOUNTER — Ambulatory Visit

## 2023-06-22 ENCOUNTER — Ambulatory Visit: Payer: Self-pay | Admitting: Nurse Practitioner

## 2023-06-22 ENCOUNTER — Ambulatory Visit (INDEPENDENT_AMBULATORY_CARE_PROVIDER_SITE_OTHER)

## 2023-06-22 ENCOUNTER — Ambulatory Visit
Admission: RE | Admit: 2023-06-22 | Discharge: 2023-06-22 | Disposition: A | Discharge status: Home / Self Care | Payer: Medicare HMO | Source: Ambulatory Visit | Attending: Nurse Practitioner | Admitting: Nurse Practitioner

## 2023-06-22 ENCOUNTER — Ambulatory Visit: Payer: Medicare HMO | Admitting: Nurse Practitioner

## 2023-06-22 VITALS — BP 126/66 | HR 62 | Temp 97.2°F | Ht 64.0 in | Wt 128.0 lb

## 2023-06-22 DIAGNOSIS — Z0001 Encounter for general adult medical examination with abnormal findings: Secondary | ICD-10-CM

## 2023-06-22 DIAGNOSIS — Z1231 Encounter for screening mammogram for malignant neoplasm of breast: Secondary | ICD-10-CM

## 2023-06-22 DIAGNOSIS — K219 Gastro-esophageal reflux disease without esophagitis: Secondary | ICD-10-CM | POA: Diagnosis not present

## 2023-06-22 DIAGNOSIS — F411 Generalized anxiety disorder: Secondary | ICD-10-CM | POA: Diagnosis not present

## 2023-06-22 DIAGNOSIS — M8588 Other specified disorders of bone density and structure, other site: Secondary | ICD-10-CM | POA: Diagnosis not present

## 2023-06-22 DIAGNOSIS — Z0000 Encounter for general adult medical examination without abnormal findings: Secondary | ICD-10-CM | POA: Diagnosis not present

## 2023-06-22 LAB — LIPID PANEL

## 2023-06-22 NOTE — Patient Instructions (Signed)
 Exercising to Stay Healthy To become healthy and stay healthy, it is recommended that you do moderate-intensity and vigorous-intensity exercise. You can tell that you are exercising at a moderate intensity if your heart starts beating faster and you start breathing faster but can still hold a conversation. You can tell that you are exercising at a vigorous intensity if you are breathing much harder and faster and cannot hold a conversation while exercising. How can exercise benefit me? Exercising regularly is important. It has many health benefits, such as: Improving overall fitness, flexibility, and endurance. Increasing bone density. Helping with weight control. Decreasing body fat. Increasing muscle strength and endurance. Reducing stress and tension, anxiety, depression, or anger. Improving overall health. What guidelines should I follow while exercising? Before you start a new exercise program, talk with your health care provider. Do not exercise so much that you hurt yourself, feel dizzy, or get very short of breath. Wear comfortable clothes and wear shoes with good support. Drink plenty of water while you exercise to prevent dehydration or heat stroke. Work out until your breathing and your heartbeat get faster (moderate intensity). How often should I exercise? Choose an activity that you enjoy, and set realistic goals. Your health care provider can help you make an activity plan that is individually designed and works best for you. Exercise regularly as told by your health care provider. This may include: Doing strength training two times a week, such as: Lifting weights. Using resistance bands. Push-ups. Sit-ups. Yoga. Doing a certain intensity of exercise for a given amount of time. Choose from these options: A total of 150 minutes of moderate-intensity exercise every week. A total of 75 minutes of vigorous-intensity exercise every week. A mix of moderate-intensity and  vigorous-intensity exercise every week. Children, pregnant women, people who have not exercised regularly, people who are overweight, and older adults may need to talk with a health care provider about what activities are safe to perform. If you have a medical condition, be sure to talk with your health care provider before you start a new exercise program. What are some exercise ideas? Moderate-intensity exercise ideas include: Walking 1 mile (1.6 km) in about 15 minutes. Biking. Hiking. Golfing. Dancing. Water aerobics. Vigorous-intensity exercise ideas include: Walking 4.5 miles (7.2 km) or more in about 1 hour. Jogging or running 5 miles (8 km) in about 1 hour. Biking 10 miles (16.1 km) or more in about 1 hour. Lap swimming. Roller-skating or in-line skating. Cross-country skiing. Vigorous competitive sports, such as football, basketball, and soccer. Jumping rope. Aerobic dancing. What are some everyday activities that can help me get exercise? Yard work, such as: Child psychotherapist. Raking and bagging leaves. Washing your car. Pushing a stroller. Shoveling snow. Gardening. Washing windows or floors. How can I be more active in my day-to-day activities? Use stairs instead of an elevator. Take a walk during your lunch break. If you drive, park your car farther away from your work or school. If you take public transportation, get off one stop early and walk the rest of the way. Stand up or walk around during all of your indoor phone calls. Get up, stretch, and walk around every 30 minutes throughout the day. Enjoy exercise with a friend. Support to continue exercising will help you keep a regular routine of activity. Where to find more information You can find more information about exercising to stay healthy from: U.S. Department of Health and Human Services: ThisPath.fi Centers for Disease Control and Prevention (  CDC): FootballExhibition.com.br Summary Exercising regularly is  important. It will improve your overall fitness, flexibility, and endurance. Regular exercise will also improve your overall health. It can help you control your weight, reduce stress, and improve your bone density. Do not exercise so much that you hurt yourself, feel dizzy, or get very short of breath. Before you start a new exercise program, talk with your health care provider. This information is not intended to replace advice given to you by your health care provider. Make sure you discuss any questions you have with your health care provider. Document Revised: 04/27/2020 Document Reviewed: 04/27/2020 Elsevier Patient Education  2024 [employer].

## 2023-06-22 NOTE — Progress Notes (Signed)
 Subjective:    Patient ID: [name], female    DOB: [birth year], 73 y.o.   MRN: [id number]  Chief Complaint: ANNUAL PHYSICAL   HPI:  [name] is a 73 y.o. who identifies as a female who was assigned female at birth.   Social history: Lives with: husband Work history: retired   Water engineer in today for follow up of the following chronic medical issues:  1. Annual physical exam With PAP- menopausal for year. Last pap was normal  2. Gastroesophageal reflux disease without esophagitis Only uses OTC meds as needed  3. GAD (generalized anxiety disorder) Is doing ok without meds    06/22/2023    9:48 AM 05/30/2022   11:46 AM 03/29/2021   11:56 AM 08/30/2020    9:35 AM  GAD 7 : Generalized Anxiety Score  Nervous, Anxious, on Edge 0 0 0 0  Control/stop worrying 0 0 0 0  Worry too much - different things 0 0 0 0  Trouble relaxing 0 0 0 0  Restless 0 0 0 0  Easily annoyed or irritable 0 0 0 0  Afraid - awful might happen 0 0 0 0  Total GAD 7 Score 0 0 0 0  Anxiety Difficulty Not difficult at all Not difficult at all Not difficult at all Not difficult at all     4. Osteopenia of lumbar spine Last dexascan as done was done on 08/31/20. Her t score was -1.4.   New complaints: None today  Allergies  Allergen Reactions   Asa [Aspirin] Other (See Comments)    Contraindicated for hernia   Ciprofloxacin Hcl Other (See Comments)    Burning  both legs below knees as if on fire.   Septra  Ds [Sulfamethoxazole -Trimethoprim ]     Rapid HR, sweats , and low blood sugar   Tagamet [Cimetidine] Rash   No outpatient encounter medications on file as of 06/22/2023.   No facility-administered encounter medications on file as of 06/22/2023.    Past Surgical History:  Procedure Laterality Date   APPENDECTOMY  03-09-2008   CHOLECYSTECTOMY  1990   CHOLECYSTECTOMY     CYSTOSCOPY W/ RETROGRADES Bilateral 01/24/2013   Procedure: CYSTOSCOPY WITH RETROGRADE PYELOGRAM;  Surgeon: Soledad Dupes, MD;  Location: [facility];  Service: Urology;  Laterality: Bilateral;   CYSTOSCOPY WITH BIOPSY N/A 01/24/2013   Procedure: CYSTOSCOPY WITH BIOPSY;  Surgeon: Soledad Dupes, MD;  Location: [facility];  Service: Urology;  Laterality: N/A;   LAPAROSCOPIC APPENDECTOMY N/A 2011   OPEN HIATAL HERNIA REPAIR  1985   TUBAL LIGATION     TUBAL LIGATION Bilateral     Family History  Problem Relation Age of Onset   COPD Mother    Cancer Father    Breast cancer Neg Hx       Controlled substance contract: n/a     Review of Systems  Constitutional:  Negative for diaphoresis.  Eyes:  Negative for pain.  Respiratory:  Negative for shortness of breath.   Cardiovascular:  Negative for chest pain, palpitations and leg swelling.  Gastrointestinal:  Negative for abdominal pain.  Endocrine: Negative for polydipsia.  Skin:  Negative for rash.  Neurological:  Negative for dizziness, weakness and headaches.  Hematological:  Does not bruise/bleed easily.  All other systems reviewed and are negative.      Objective:   Physical Exam Vitals and nursing note reviewed.  Constitutional:      General: She is not in  acute distress.    Appearance: Normal appearance. She is well-developed.  HENT:     Head: Normocephalic.     Right Ear: Tympanic membrane normal.     Left Ear: Tympanic membrane normal.     Nose: Nose normal.     Mouth/Throat:     Mouth: Mucous membranes are moist.  Eyes:     Pupils: Pupils are equal, round, and reactive to light.  Neck:     Vascular: No carotid bruit or JVD.  Cardiovascular:     Rate and Rhythm: Normal rate and regular rhythm.     Heart sounds: Normal heart sounds.  Pulmonary:     Effort: Pulmonary effort is normal. No respiratory distress.     Breath sounds: Normal breath sounds. No wheezing or rales.  Chest:     Chest wall: No tenderness.  Abdominal:     General: Bowel sounds are normal. There is no distension  or abdominal bruit.     Palpations: Abdomen is soft. There is no hepatomegaly, splenomegaly, mass or pulsatile mass.     Tenderness: There is no abdominal tenderness.  Genitourinary:    General: Normal vulva.     Vagina: No vaginal discharge.     Rectum: Normal.     Comments: Cervix non parous and pink No adnexal masses or tenderness Musculoskeletal:        General: Normal range of motion.     Cervical back: Normal range of motion and neck supple.  Lymphadenopathy:     Cervical: No cervical adenopathy.  Skin:    General: Skin is warm and dry.  Neurological:     Mental Status: She is alert and oriented to person, place, and time.     Deep Tendon Reflexes: Reflexes are normal and symmetric.  Psychiatric:        Behavior: Behavior normal.        Thought Content: Thought content normal.        Judgment: Judgment normal.    BP 126/66   Pulse 62   Temp (!) 97.2 F (36.2 C) (Temporal)   Ht 5\' 4"  (1.626 m)   Wt 128 lb (58.1 kg)   SpO2 99%   BMI 21.97 kg/m   EKG- NsR-Mary-Margaret Branden Vine, FNP  Chest xray normal- no acute or chronic finding-Preliminary reading by Irvine Mantis, FNP  [facility]        Assessment & Plan:  Tor Freed comes in today with chief complaint of annual physical  Diagnosis and orders addressed:  1. Annual physical exam - CBC with Differential/Platelet - CMP14+EGFR - Lipid panel - Thyroid  Panel With TSH - VITAMIN D  25 Hydroxy (Vit-D Deficiency, Fractures) - Cytology - PAP  2. Gastroesophageal reflux disease without esophagitis Avoid spicy foods Do not eat 2 hours prior to bedtime   3. GAD (generalized anxiety disorder) Stress management  4. Osteopenia of lumbar spine Weight bearing exercises Refuses to repeat dexascan today  Labs pending Health Maintenance reviewed Diet and exercise encouraged  Follow up plan: 1 year   Mary-Margaret Gaylyn Keas, FNP

## 2023-06-22 NOTE — Addendum Note (Signed)
 Addended by: Cherylyn Cos on: 06/22/2023 10:26 AM   Modules accepted: Orders

## 2023-06-23 LAB — CMP14+EGFR
ALT: 9 IU/L (ref 0–32)
AST: 17 IU/L (ref 0–40)
Albumin: 4.5 g/dL (ref 3.9–4.9)
Alkaline Phosphatase: 64 IU/L (ref 44–121)
BUN/Creatinine Ratio: 15 (ref 12–28)
BUN: 16 mg/dL (ref 8–27)
Bilirubin Total: 0.9 mg/dL (ref 0.0–1.2)
CO2: 20 mmol/L (ref 20–29)
Calcium: 9.6 mg/dL (ref 8.7–10.3)
Chloride: 102 mmol/L (ref 96–106)
Creatinine, Ser: 1.08 mg/dL — ABNORMAL HIGH (ref 0.57–1.00)
Globulin, Total: 2.9 g/dL (ref 1.5–4.5)
Glucose: 91 mg/dL (ref 70–99)
Potassium: 4 mmol/L (ref 3.5–5.2)
Sodium: 138 mmol/L (ref 134–144)
Total Protein: 7.4 g/dL (ref 6.0–8.5)
eGFR: 55 mL/min/{1.73_m2} — ABNORMAL LOW (ref >59)

## 2023-06-23 LAB — CBC WITH DIFFERENTIAL/PLATELET
Basophils Absolute: 0 10*3/uL (ref 0.0–0.2)
Basos: 1 %
EOS (ABSOLUTE): 0 10*3/uL (ref 0.0–0.4)
Eos: 1 %
Hematocrit: 38.4 % (ref 34.0–46.6)
Hemoglobin: 12.4 g/dL (ref 11.1–15.9)
Immature Grans (Abs): 0 10*3/uL (ref 0.0–0.1)
Immature Granulocytes: 0 %
Lymphocytes Absolute: 1.5 10*3/uL (ref 0.7–3.1)
Lymphs: 25 %
MCH: 31.7 pg (ref 26.6–33.0)
MCHC: 32.3 g/dL (ref 31.5–35.7)
MCV: 98 fL — ABNORMAL HIGH (ref 79–97)
Monocytes Absolute: 0.4 10*3/uL (ref 0.1–0.9)
Monocytes: 8 %
Neutrophils Absolute: 3.9 10*3/uL (ref 1.4–7.0)
Neutrophils: 65 %
Platelets: 234 10*3/uL (ref 150–450)
RBC: 3.91 x10E6/uL (ref 3.77–5.28)
RDW: 11.3 % — ABNORMAL LOW (ref 11.7–15.4)
WBC: 5.8 10*3/uL (ref 3.4–10.8)

## 2023-06-23 LAB — THYROID PANEL WITH TSH
Free Thyroxine Index: 3.2 (ref 1.2–4.9)
T3 Uptake Ratio: 35 % (ref 24–39)
T4, Total: 9.1 ug/dL (ref 4.5–12.0)
TSH: 0.796 u[IU]/mL (ref 0.450–4.500)

## 2023-06-23 LAB — LIPID PANEL
Cholesterol, Total: 209 mg/dL — ABNORMAL HIGH (ref 100–199)
HDL: 78 mg/dL (ref >39)
LDL CALC COMMENT:: 2.7 ratio (ref 0.0–4.4)
LDL Chol Calc (NIH): 116 mg/dL — ABNORMAL HIGH (ref 0–99)
Triglycerides: 84 mg/dL (ref 0–149)
VLDL Cholesterol Cal: 15 mg/dL (ref 5–40)

## 2023-06-23 LAB — VITAMIN D 25 HYDROXY (VIT D DEFICIENCY, FRACTURES): Vit D, 25-Hydroxy: 37.7 ng/mL (ref 30.0–100.0)

## 2023-07-01 ENCOUNTER — Ambulatory Visit (INDEPENDENT_AMBULATORY_CARE_PROVIDER_SITE_OTHER)

## 2023-07-01 ENCOUNTER — Other Ambulatory Visit: Payer: Self-pay | Admitting: Nurse Practitioner

## 2023-07-01 DIAGNOSIS — Z780 Asymptomatic menopausal state: Secondary | ICD-10-CM

## 2023-07-02 ENCOUNTER — Telehealth: Payer: Self-pay

## 2023-07-02 DIAGNOSIS — M8589 Other specified disorders of bone density and structure, multiple sites: Secondary | ICD-10-CM | POA: Diagnosis not present

## 2023-07-02 DIAGNOSIS — Z780 Asymptomatic menopausal state: Secondary | ICD-10-CM | POA: Diagnosis not present

## 2023-07-02 NOTE — Telephone Encounter (Signed)
 Patient came in for DEXA - requesting results for labs and recent CXR. Advised the patient of the results and that they were sent to her MyChart. Patient states that she does not check MyChart for results. Patient would like to know if she should start taking fish oil for her cholesterol and if so how much.

## 2023-07-03 ENCOUNTER — Telehealth: Payer: Self-pay

## 2023-07-03 ENCOUNTER — Ambulatory Visit: Payer: Self-pay | Admitting: Nurse Practitioner

## 2023-07-03 NOTE — Telephone Encounter (Signed)
 Copied from CRM [id number]. Topic: Clinical - Lab/Test Results >> Jul 03, 2023 11:57 AM Leory Rands wrote: Reason for CRM: Patient is calling to receive Dexa Scan results. Expressed understanding. No questions at this time.

## 2023-07-03 NOTE — Telephone Encounter (Signed)
 Mailed patient a copy of most recent lab results and also her bone density results

## 2023-12-01 DIAGNOSIS — R051 Acute cough: Secondary | ICD-10-CM | POA: Diagnosis not present

## 2023-12-01 DIAGNOSIS — B349 Viral infection, unspecified: Secondary | ICD-10-CM | POA: Diagnosis not present

## 2023-12-01 DIAGNOSIS — J069 Acute upper respiratory infection, unspecified: Secondary | ICD-10-CM | POA: Diagnosis not present

## 2023-12-21 ENCOUNTER — Ambulatory Visit: Admitting: Nurse Practitioner

## 2024-01-05 ENCOUNTER — Encounter: Payer: Self-pay | Admitting: Nurse Practitioner

## 2024-01-05 ENCOUNTER — Ambulatory Visit: Payer: Self-pay | Admitting: Nurse Practitioner

## 2024-01-05 VITALS — BP 131/70 | HR 60 | Temp 97.8°F | Ht 64.0 in | Wt 122.0 lb

## 2024-01-05 DIAGNOSIS — R55 Syncope and collapse: Secondary | ICD-10-CM

## 2024-01-05 NOTE — Patient Instructions (Signed)
Syncope, Adult  Syncope is when you pass out or faint for a short time. It is caused by a sudden decrease in blood flow to the brain. This can happen for many reasons. It can sometimes happen when seeing blood, getting a shot (injection), or having pain or strong emotions. Most causes of fainting are not dangerous, but in some cases it can be a sign of a serious medical problem. If you faint, get help right away. Call your local emergency services (911 in the [place] Follow these instructions at home: Watch for any changes in your symptoms. Take these actions to stay safe and help with your symptoms: Knowing when you may be about to faint Signs that you may be about to faint include: Feeling dizzy or light-headed. It may feel like the room is spinning. Feeling weak. Feeling like you may vomit (nauseous). Seeing spots or seeing all white or all black. Having cold, clammy skin. Feeling warm and sweaty. Hearing ringing in the ears. If you start to feel like you might faint, sit or lie down right away. If sitting, lower your head down between your legs. If lying down, raise (elevate) your feet above the level of your heart. Breathe deeply and steadily. Wait until all of the symptoms are gone. Have someone stay with you until you feel better. Medicines Take over-the-counter and prescription medicines only as told by your doctor. If you are taking blood pressure or heart medicine, sit up and stand up slowly. Spend a few minutes getting ready to sit and then stand. This can help you feel less dizzy. Lifestyle Do not drive, use machinery, or play sports until your doctor says it is okay. Do not drink alcohol. Do not smoke or use any products that contain nicotine or tobacco. If you need help quitting, ask your doctor. Avoid hot tubs and saunas. General instructions Talk with your doctor about your symptoms. You may need to have testing to help find the cause. Drink enough fluid to keep your pee  (urine) pale yellow. Avoid standing for a long time. If you must stand for a long time, do movements such as: Moving your legs. Crossing your legs. Flexing and stretching your leg muscles. Squatting. Keep all follow-up visits. Contact a doctor if: You have episodes of near fainting. Get help right away if: You pass out or faint. You hit your head or are injured after fainting. You have any of these symptoms: Fast or uneven heartbeats (palpitations). Pain in your chest, belly, or back. Shortness of breath. You have jerky movements that you cannot control (seizure). You have a very bad headache. You are confused. You have problems with how you see (vision). You are very weak. You have trouble walking. You are bleeding from your mouth or your butt (rectum). You have black or tarry poop (stool). These symptoms may be an emergency. Get help right away. Call your local emergency services (911 in the [place] Do not wait to see if the symptoms will go away. Do not drive yourself to the hospital. Summary Syncope is when you pass out or faint for a short time. It is caused by a sudden decrease in blood flow to the brain. Signs that you may be about to faint include feeling dizzy or light-headed, feeling like you may vomit, seeing all white or all black, or having cold, clammy skin. If you start to feel like you might faint, sit or lie down right away. Lower your head if sitting, or raise (elevate)   your feet if lying down. Breathe deeply and steadily. Wait until all of the symptoms are gone. This information is not intended to replace advice given to you by your health care provider. Make sure you discuss any questions you have with your health care provider. Document Revised: 05/10/2020 Document Reviewed: 05/10/2020 Elsevier Patient Education  2024 [employer].

## 2024-01-05 NOTE — Progress Notes (Signed)
" ° °  Subjective:    Patient ID: [name], female    DOB: 1950-04-26, 72 y.o.   MRN: [id number]   Chief Complaint: [name] (Passed Dixon on Sunday)   HPI  Patient says she got up Sunday morning and felt like she was getting a cold. She went on and started fixing breakfast. While waiting for biscuits  to cook, she walked into living room and the next thing she knew her daughter was waking her up. She did not hit her head when she fell. This happened several years ago and she was dx with dehydration. They did not call EMS. He gave her some Pedialyte at home and she started feeling better. She has been able to do normal activities without any issues since Sunday. Says she feels like her usual self today.   Patient Active Problem List   Diagnosis Date Noted   Trigger ring finger of right hand 03/24/2019   GAD (generalized anxiety disorder) 10/09/2017   Osteopenia 10/12/2014   GERD (gastroesophageal reflux disease) 02/13/2013       Review of Systems  Constitutional:  Negative for diaphoresis.  Eyes:  Negative for pain.  Respiratory:  Negative for shortness of breath.   Cardiovascular:  Negative for chest pain, palpitations and leg swelling.  Gastrointestinal:  Negative for abdominal pain.  Endocrine: Negative for polydipsia.  Skin:  Negative for rash.  Neurological:  Negative for dizziness, weakness and headaches.  Hematological:  Does not bruise/bleed easily.  All other systems reviewed and are negative.      Objective:   Physical Exam Constitutional:      Appearance: Normal appearance. She is obese.  Cardiovascular:     Rate and Rhythm: Normal rate and regular rhythm.     Heart sounds: Normal heart sounds.  Pulmonary:     Effort: Pulmonary effort is normal.     Breath sounds: Normal breath sounds.  Skin:    General: Skin is warm.  Neurological:     General: No focal deficit present.     Mental Status: She is alert and oriented to person, place, and time.   Psychiatric:        Mood and Affect: Mood normal.        Behavior: Behavior normal.     BP 131/70   Pulse 60   Temp 97.8 F (36.6 C) (Temporal)   Ht 5' 4 (1.626 m)   Wt 122 lb (55.3 kg)   SpO2 97%   BMI 20.94 kg/m   EKG- normal     Assessment & Plan:   [name] in today with chief complaint of Passed Dixon (Passed Dixon on Sunday)   1. Syncope, unspecified syncope type (Primary) Force fluids RTO prn - EKG 12-Lead    The above assessment and management plan was discussed with the patient. The patient verbalized understanding of and has agreed to the management plan. Patient is aware to call the clinic if symptoms persist or worsen. Patient is aware when to return to the clinic for a follow-up visit. Patient educated on when it is appropriate to go to the emergency department.   Mary-Margaret Gladis, FNP   "

## 2024-01-06 LAB — BMP8+EGFR
BUN/Creatinine Ratio: 11 — ABNORMAL LOW (ref 12–28)
BUN: 10 mg/dL (ref 8–27)
CO2: 21 mmol/L (ref 20–29)
Calcium: 9.3 mg/dL (ref 8.7–10.3)
Chloride: 104 mmol/L (ref 96–106)
Creatinine, Ser: 0.89 mg/dL (ref 0.57–1.00)
Glucose: 96 mg/dL (ref 70–99)
Potassium: 4.7 mmol/L (ref 3.5–5.2)
Sodium: 141 mmol/L (ref 134–144)
eGFR: 69 mL/min/1.73

## 2024-01-06 LAB — CBC WITH DIFFERENTIAL/PLATELET
Basophils Absolute: 0 x10E3/uL (ref 0.0–0.2)
Basos: 1 %
EOS (ABSOLUTE): 0.1 x10E3/uL (ref 0.0–0.4)
Eos: 1 %
Hematocrit: 37.9 % (ref 34.0–46.6)
Hemoglobin: 12.3 g/dL (ref 11.1–15.9)
Immature Grans (Abs): 0 x10E3/uL (ref 0.0–0.1)
Immature Granulocytes: 0 %
Lymphocytes Absolute: 1.2 x10E3/uL (ref 0.7–3.1)
Lymphs: 18 %
MCH: 31.9 pg (ref 26.6–33.0)
MCHC: 32.5 g/dL (ref 31.5–35.7)
MCV: 98 fL — ABNORMAL HIGH (ref 79–97)
Monocytes Absolute: 0.6 x10E3/uL (ref 0.1–0.9)
Monocytes: 8 %
Neutrophils Absolute: 4.8 x10E3/uL (ref 1.4–7.0)
Neutrophils: 72 %
Platelets: 261 x10E3/uL (ref 150–450)
RBC: 3.85 x10E6/uL (ref 3.77–5.28)
RDW: 11.3 % — ABNORMAL LOW (ref 11.7–15.4)
WBC: 6.7 x10E3/uL (ref 3.4–10.8)

## 2024-01-18 DIAGNOSIS — S161XXA Strain of muscle, fascia and tendon at neck level, initial encounter: Secondary | ICD-10-CM

## 2024-01-18 MED ORDER — TIZANIDINE HCL 4 MG PO TABS: 4.0000 mg | ORAL_TABLET | Freq: Four times a day (QID) | ORAL | 0 refills | Status: AC | PRN

## 2024-06-24 ENCOUNTER — Ambulatory Visit: Payer: Self-pay | Admitting: Nurse Practitioner
